# Patient Record
Sex: Male | Born: 1990 | Race: Black or African American | Hispanic: No | Marital: Single | Smoking: Never smoker
Health system: Southern US, Community
[De-identification: ages and names within clinical notes are randomized; demographics above are authoritative.]

## PROBLEM LIST (undated history)

## (undated) DIAGNOSIS — T7840XA Allergy, unspecified, initial encounter: Secondary | ICD-10-CM

## (undated) DIAGNOSIS — E119 Type 2 diabetes mellitus without complications: Secondary | ICD-10-CM

## (undated) HISTORY — PX: KNEE SURGERY: SHX244

## (undated) HISTORY — DX: Allergy, unspecified, initial encounter: T78.40XA

---
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
## 2008-05-03 ENCOUNTER — Emergency Department (HOSPITAL_COMMUNITY): Admission: EM | Admit: 2008-05-03 | Discharge: 2008-05-03 | Payer: Self-pay | Admitting: Emergency Medicine

## 2010-03-06 ENCOUNTER — Emergency Department (HOSPITAL_COMMUNITY): Admission: EM | Admit: 2010-03-06 | Discharge: 2010-03-06 | Payer: Self-pay | Admitting: Emergency Medicine

## 2011-05-10 NOTE — Op Note (Signed)
NAME:  Calvin Huber, REMER             ACCOUNT NO.:  [id number]   MEDICAL RECORD NO.:  [id number]          PATIENT TYPE:  AMB   LOCATION:  SDS                          FACILITY:  [facility]   PHYSICIAN:  Burnard Bunting, M.D.    DATE OF BIRTH:  August 25, 1991   DATE OF PROCEDURE:  08/06/2005  DATE OF DISCHARGE:                                 OPERATIVE REPORT   PREOPERATIVE DIAGNOSIS:  Right knee lateral meniscal tear.   POSTOPERATIVE DIAGNOSIS:  Right knee lateral meniscal tear.   OPERATION PERFORMED:  Right knee diagnostic and operative arthroscopy with  partial lateral meniscectomy.   SURGEON:  Burnard Bunting, M.D.   ASSISTANT:  Jerolyn Shin. Tresa Res, M.D.   ANESTHESIA:  General endotracheal.   ESTIMATED BLOOD LOSS:  Minimal.   TOURNIQUET TIME:  36 minutes at 350 mmHg.   OPERATIVE FINDINGS:  1.  Examination under anesthesia, range of motion 0 to 130 degrees,      stability to varus and valgus stress.  ACL and PCL intact.  2.  Diagnostic and operative arthroscopy.  (1) Intact patellofemoral      compartment.  (2)  Intact medial and lateral gutters with no loose      bodies.  (3) Intact medial compartment.  (4) Intact  anterior cruciate      ligament and posterior cruciate ligament.  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   DESCRIPTION OF PROCEDURE:  The patient was brought to the operating room  where general endotracheal anesthesia was introduced.  Preoperative  antibiotics were administered.  The right leg was prepped with DuraPrep  solution and draped in sterile manner.  The leg was elevated and  exsanguinated with Esmarch.  Tourniquet was inflated.  Anterior inferior and  lateral portal was created.  Anterior inferior medial portal was  then  created under direct visualization after topographical anatomy was defined.  Patellofemoral compartment was intact.  Medial compartment was intact.  ACL  and PCL was intact.  Patient did have a lateral meniscal tear which was a  bucket handle type in horizontal orientation.  There was radial tear through  the bucket handle portion making the tear unrepairable. The tear was  resected back to a stable rim leaving approximately 40% of the anterior  posterior width and volume of the meniscus intact.  At this time instruments  were removed from the portals.  Solution of Marcaine,  morphine and clonidine was injected into the knee.  The patient's portals  were closed using a 3-0 nylon suture.  Tourniquet was released.  Compressive  dressing was applied.  The patient tolerated the procedure well without  immediate complications.           ______________________________  G. Dorene Grebe, M.D.     GSD/MEDQ  D:  08/06/2005  T:  08/06/2005  Job:  [id number]

## 2014-06-18 ENCOUNTER — Ambulatory Visit (INDEPENDENT_AMBULATORY_CARE_PROVIDER_SITE_OTHER): Payer: 59 | Admitting: Physician Assistant

## 2014-06-18 VITALS — BP 120/82 | HR 71 | Temp 98.6°F | Resp 20 | Ht 74.0 in | Wt >= 6400 oz

## 2014-06-18 DIAGNOSIS — Z0000 Encounter for general adult medical examination without abnormal findings: Secondary | ICD-10-CM

## 2014-06-18 DIAGNOSIS — Z113 Encounter for screening for infections with a predominantly sexual mode of transmission: Secondary | ICD-10-CM

## 2014-06-18 LAB — POCT URINALYSIS DIPSTICK
Bilirubin, UA: NEGATIVE
Glucose, UA: NEGATIVE
KETONES UA: NEGATIVE
Leukocytes, UA: NEGATIVE
Nitrite, UA: NEGATIVE
PH UA: 5
PROTEIN UA: NEGATIVE
RBC UA: NEGATIVE
SPEC GRAV UA: 1.015
Urobilinogen, UA: 0.2

## 2014-06-18 LAB — TSH: TSH: 1.259 u[IU]/mL (ref 0.350–4.500)

## 2014-06-18 LAB — LIPID PANEL
Cholesterol: 176 mg/dL (ref 0–200)
HDL: 49 mg/dL (ref >39)
LDL Cholesterol: 114 mg/dL — ABNORMAL HIGH (ref 0–99)
TRIGLYCERIDES: 63 mg/dL (ref <150)
Total CHOL/HDL Ratio: 3.6 Ratio
VLDL: 13 mg/dL (ref 0–40)

## 2014-06-18 LAB — CBC
HCT: 42.3 % (ref 39.0–52.0)
Hemoglobin: 14.3 g/dL (ref 13.0–17.0)
MCH: 26.7 pg (ref 26.0–34.0)
MCHC: 33.8 g/dL (ref 30.0–36.0)
MCV: 79.1 fL (ref 78.0–100.0)
Platelets: 275 10*3/uL (ref 150–400)
RBC: 5.35 MIL/uL (ref 4.22–5.81)
RDW: 14.4 % (ref 11.5–15.5)
WBC: 7.7 10*3/uL (ref 4.0–10.5)

## 2014-06-18 LAB — COMPREHENSIVE METABOLIC PANEL
ALBUMIN: 4.5 g/dL (ref 3.5–5.2)
ALT: 29 U/L (ref 0–53)
AST: 21 U/L (ref 0–37)
Alkaline Phosphatase: 37 U/L — ABNORMAL LOW (ref 39–117)
BILIRUBIN TOTAL: 0.6 mg/dL (ref 0.2–1.2)
BUN: 12 mg/dL (ref 6–23)
CHLORIDE: 102 meq/L (ref 96–112)
CO2: 29 mEq/L (ref 19–32)
CREATININE: 1.22 mg/dL (ref 0.50–1.35)
Calcium: 9.6 mg/dL (ref 8.4–10.5)
Glucose, Bld: 97 mg/dL (ref 70–99)
POTASSIUM: 4.7 meq/L (ref 3.5–5.3)
SODIUM: 140 meq/L (ref 135–145)
TOTAL PROTEIN: 7.7 g/dL (ref 6.0–8.3)

## 2014-06-18 LAB — RPR

## 2014-06-18 LAB — HIV ANTIBODY (ROUTINE TESTING W REFLEX): HIV 1&2 Ab, 4th Generation: NONREACTIVE

## 2014-06-18 NOTE — Patient Instructions (Signed)
I will let you know when your lab results are back and if we need to do anything based on them  Everything looks good on exam, but it will be very beneficial for you to work on weight loss.  Your BMI is 51.77 which puts you in the "severe obesity" category (normal BMI is 20-24.)  You are already doing many good things for your health, but we just need to optimize these!  Diet:  Try to consume around 1900-2000 calories per day.  I recommend using an app like MyFitnessPal or LoseIt to keep track of your calories - my guess is that you are taking in more than you think you are!  Make sure to document everything you consume (including drinks!)  Plan to track your calories for a week or so before making any changes, so we know your baseline.    Foods to eat:  Anything fresh!  Vegetables, fruits, whole grains lean meats.  Plenty of water to drink  Foods to avoid:  Anything processed (i.e. Anything that comes in a package)  Limit fast food, take out, junk food.  Try to completely eliminate sugar sweetened beverages like soda, sweet tea, sports drinks and juices.  Occasional diet (i.e. Calorie free) drinks are ok  Exercise:  Aim for 150 minutes per week of aerobic exercise.  I want you working hard enough that you could carry on a conversation in short bursts but not be able to sing a song because you are working so hard.  My guess is that you are not exercising as much or as intensely as you think you are.  A mix of cardiovascular and strength training will be most beneficial.  Walking is great - just make sure you are walking briskly!  If you are having trouble losing weight with lifestyle modifications alone, we may want to consider discussing weight loss surgery.  That is a BIG decision, but I think you would see a lot of benefit from it and would be healthier over the long term if we could help you get some weight off  Keep up the good work!  And plan to follow up in about a year for another physical,  sooner if anything comes up or you want to discuss weight loss further  Dentists: [facility]  [phone] PaulsJerry Reeves DDS [phone]   Health Maintenance A healthy lifestyle and preventative care can promote health and wellness.  Maintain regular health, dental, and eye exams.  Eat a healthy diet. Foods like vegetables, fruits, whole grains, low-fat dairy products, and lean protein foods contain the nutrients you need and are low in calories. Decrease your intake of foods high in solid fats, added sugars, and salt. Get information about a proper diet from your health care provider, if necessary.  Regular physical exercise is one of the most important things you can do for your health. Most adults should get at least 150 minutes of moderate-intensity exercise (any activity that increases your heart rate and causes you to sweat) each week. In addition, most adults need muscle-strengthening exercises on 2 or more days a week.   Maintain a healthy weight. The body mass index (BMI) is a screening tool to identify possible weight problems. It provides an estimate of body fat based on height and weight. Your health care provider can find your BMI and can help you achieve or maintain a healthy weight. For males 20 years and older:  A BMI below 18.5 is considered underweight.  A BMI of 18.5 to 24.9 is normal.  A BMI of 25 to 29.9 is considered overweight.  A BMI of 30 and above is considered obese.  Maintain normal blood lipids and cholesterol by exercising and minimizing your intake of saturated fat. Eat a balanced diet with plenty of fruits and vegetables. Blood tests for lipids and cholesterol should begin at age 97 and be repeated every 5 years. If your lipid or cholesterol levels are high, you are over age 66, or you are at high risk for heart disease, you may need your cholesterol levels checked more frequently.Ongoing high lipid and cholesterol levels should be treated with  medicines if diet and exercise are not working.  If you smoke, find out from your health care provider how to quit. If you do not use tobacco, do not start.  Lung cancer screening is recommended for adults aged 55-80 years who are at high risk for developing lung cancer because of a history of smoking. A yearly low-dose CT scan of the lungs is recommended for people who have at least a 30-pack-year history of smoking and are current smokers or have quit within the past 15 years. A pack year of smoking is smoking an average of 1 pack of cigarettes a day for 1 year (for example, a 30-pack-year history of smoking could mean smoking 1 pack a day for 30 years or 2 packs a day for 15 years). Yearly screening should continue until the smoker has stopped smoking for at least 15 years. Yearly screening should be stopped for people who develop a health problem that would prevent them from having lung cancer treatment.  If you choose to drink alcohol, do not have more than 2 drinks per day. One drink is considered to be 12 oz (360 mL) of beer, 5 oz (150 mL) of wine, or 1.5 oz (45 mL) of liquor.  Avoid the use of street drugs. Do not share needles with anyone. Ask for help if you need support or instructions about stopping the use of drugs.  High blood pressure causes heart disease and increases the risk of stroke. Blood pressure should be checked at least every 1-2 years. Ongoing high blood pressure should be treated with medicines if weight loss and exercise are not effective.  If you are 10-36 years old, ask your health care provider if you should take aspirin to prevent heart disease.  Diabetes screening involves taking a blood sample to check your fasting blood sugar level. This should be done once every 3 years after age 57 if you are at a normal weight and without risk factors for diabetes. Testing should be considered at a younger age or be carried out more frequently if you are overweight and have at  least 1 risk factor for diabetes.  Colorectal cancer can be detected and often prevented. Most routine colorectal cancer screening begins at the age of 80 and continues through age 66. However, your health care provider may recommend screening at an earlier age if you have risk factors for colon cancer. On a yearly basis, your health care provider may provide home test kits to check for hidden blood in the stool. A small camera at the end of a tube may be used to directly examine the colon (sigmoidoscopy or colonoscopy) to detect the earliest forms of colorectal cancer. Talk to your health care provider about this at age 43 when routine screening begins. A direct exam of the colon should be repeated every 5-10 years  through age 25, unless early forms of precancerous polyps or small growths are found.  People who are at an increased risk for hepatitis B should be screened for this virus. You are considered at high risk for hepatitis B if:  You were born in a country where hepatitis B occurs often. Talk with your health care provider about which countries are considered high risk.  Your parents were born in a high-risk country and you have not received a shot to protect against hepatitis B (hepatitis B vaccine).  You have HIV or AIDS.  You use needles to inject street drugs.  You live with, or have sex with, someone who has hepatitis B.  You are a man who has sex with other men (MSM).  You get hemodialysis treatment.  You take certain medicines for conditions like cancer, organ transplantation, and autoimmune conditions.  Hepatitis C blood testing is recommended for all people born from [birth year] through 1965 and any individual with known risk factors for hepatitis C.  Healthy men should no longer receive prostate-specific antigen (PSA) blood tests as part of routine cancer screening. Talk to your health care provider about prostate cancer screening.  Testicular cancer screening is not recommended  for adolescents or adult males who have no symptoms. Screening includes self-exam, a health care provider exam, and other screening tests. Consult with your health care provider about any symptoms you have or any concerns you have about testicular cancer.  Practice safe sex. Use condoms and avoid high-risk sexual practices to reduce the spread of sexually transmitted infections (STIs).  You should be screened for STIs, including gonorrhea and chlamydia if:  You are sexually active and are younger than 24 years.  You are older than 24 years, and your health care provider tells you that you are at risk for this type of infection.  Your sexual activity has changed since you were last screened, and you are at an increased risk for chlamydia or gonorrhea. Ask your health care provider if you are at risk.  If you are at risk of being infected with HIV, it is recommended that you take a prescription medicine daily to prevent HIV infection. This is called pre-exposure prophylaxis (PrEP). You are considered at risk if:  You are a man who has sex with other men (MSM).  You are a heterosexual man who is sexually active with multiple partners.  You take drugs by injection.  You are sexually active with a partner who has HIV.  Talk with your health care provider about whether you are at high risk of being infected with HIV. If you choose to begin PrEP, you should first be tested for HIV. You should then be tested every 3 months for as long as you are taking PrEP.  Use sunscreen. Apply sunscreen liberally and repeatedly throughout the day. You should seek shade when your shadow is shorter than you. Protect yourself by wearing long sleeves, pants, a wide-brimmed hat, and sunglasses year round whenever you are outdoors.  Tell your health care provider of new moles or changes in moles, especially if there is a change in shape or color. Also, tell your health care provider if a mole is larger than the size  of a pencil eraser.  A one-time screening for abdominal aortic aneurysm (AAA) and surgical repair of large AAAs by ultrasound is recommended for men aged 65-75 years who are current or former smokers.  Stay current with your vaccines (immunizations). Document Released: 06/06/2008 Document Revised: 12/14/2013  Document Reviewed: 05/06/2011 [facility] Patient Information 2015 [place]. This information is not intended to replace advice given to you by your health care provider. Make sure you discuss any questions you have with your health care provider.

## 2014-06-18 NOTE — Progress Notes (Signed)
   Subjective:    Patient ID: Calvin Huber, male    DOB: May 05, 1991, 23 y.o.   MRN: [id number]  HPI  Calvin Huber is a very pleasant 23 yr old male here for CPE.  Last CPE 2013.  No PCP  Complaints:  none Imm:  Thinks utd - thinks tetanus 2012 Dentist:  Last 2010; [place] in [place] Eye doctor:  No corrective lenses Diet:  Varied diet - more vegetables than fruits; lots of eating out due to travel for work; plenty of water; no soda but does drink sweet tea, sports, drinks, juice Exercise:  Basketball, walking, running - once per week Meds: none PMH:  None Sexually active 1 male partner; condoms 50% of the time; no specific concern for STI, would like testing, last tested 2011  Work:  Emergency planning/management officerroject manager, travels frequently   Review of Systems  Constitutional: Negative.   HENT: Negative.   Eyes: Negative.   Respiratory: Negative.   Cardiovascular: Negative.   Gastrointestinal: Negative.   Genitourinary: Negative.   Musculoskeletal: Negative.   Skin: Negative.   Neurological: Negative.        Objective:   Physical Exam  Vitals reviewed. Constitutional: He is oriented to person, place, and time. He appears well-developed and well-nourished. No distress.  HENT:  Head: Normocephalic and atraumatic.  Right Ear: Tympanic membrane and ear canal normal.  Left Ear: Tympanic membrane and ear canal normal.  Mouth/Throat: Uvula is midline, oropharynx is clear and moist and mucous membranes are normal.  Eyes: Conjunctivae and EOM are normal. Pupils are equal, round, and reactive to light. No scleral icterus.  Neck: Normal range of motion. Neck supple.  Cardiovascular: Normal rate, regular rhythm, normal heart sounds and intact distal pulses.   No murmur heard. Pulmonary/Chest: Effort normal and breath sounds normal. He has no wheezes. He has no rales.  Abdominal: Soft. Bowel sounds are normal. There is no tenderness.  Lymphadenopathy:    He has no cervical adenopathy.    Neurological: He is alert and oriented to person, place, and time. He has normal reflexes.  Skin: Skin is warm and dry.  Psychiatric: He has a normal mood and affect. His behavior is normal.        Assessment & Plan:  1. Routine general medical examination at a health care facility Calvin Huber is a very pleasant 23 yr old male here for CPE.  He appears to be in good health and exam is normal.  Labs drawn.  Discussed lifestyle modification (see below)  - CBC - POCT urinalysis dipstick - Comprehensive metabolic panel - GC/Chlamydia Probe Amp - HIV antibody - Lipid panel - RPR - TSH  2. Screen for STD (sexually transmitted disease)  - GC/Chlamydia Probe Amp - HIV antibody - RPR  3. Severe obesity (BMI >= 40) Pt with BMI 51.77.  Discussed with pt that this is very high and that weight loss will be very beneficial for him.  We discussed weight loss strategies.  See patient instructions for more details.  We very briefly discussed the possibility of surgical intervention, but pt is confident that he can bring his weight down with lifestyle mods alone  - Comprehensive metabolic panel - Lipid panel    E. Frances FurbishElizabeth Egan MHS, PA-C [facility] 6/27/20156:43 PM

## 2014-06-21 ENCOUNTER — Telehealth: Payer: Self-pay

## 2014-06-21 LAB — GC/CHLAMYDIA PROBE AMP
CT Probe RNA: NEGATIVE
GC PROBE AMP APTIMA: NEGATIVE

## 2014-06-21 NOTE — Telephone Encounter (Signed)
Spoke with pt and informed him his labs were normal and all STD/STI testing was negative.

## 2014-12-04 ENCOUNTER — Encounter (HOSPITAL_COMMUNITY): Payer: Self-pay | Admitting: Emergency Medicine

## 2014-12-04 ENCOUNTER — Emergency Department (INDEPENDENT_AMBULATORY_CARE_PROVIDER_SITE_OTHER)
Admission: EM | Admit: 2014-12-04 | Discharge: 2014-12-04 | Disposition: A | Discharge status: Home / Self Care | Payer: 59 | Source: Home / Self Care

## 2014-12-04 DIAGNOSIS — J020 Streptococcal pharyngitis: Secondary | ICD-10-CM

## 2014-12-04 LAB — POCT RAPID STREP A: STREPTOCOCCUS, GROUP A SCREEN (DIRECT): POSITIVE — AB

## 2014-12-04 MED ORDER — CEFDINIR 300 MG PO CAPS: 300.0000 mg | ORAL_CAPSULE | Freq: Two times a day (BID) | ORAL | Status: DC

## 2014-12-04 NOTE — Discharge Instructions (Signed)
Drink lots of fluids, take all of medicine, use lozenges as needed.return if needed °

## 2014-12-04 NOTE — ED Provider Notes (Signed)
CSN: [id number]     Arrival date & time 12/04/14  1125 History   None    Chief Complaint  Patient presents with  . Sore Throat   (Consider location/radiation/quality/duration/timing/severity/associated sxs/prior Treatment) Patient is a 23 y.o. male presenting with pharyngitis. The history is provided by the patient and the spouse.  Sore Throat This is a new problem. The current episode started 2 days ago. The problem has been gradually worsening. Pertinent negatives include no chest pain and no abdominal pain. The symptoms are aggravated by swallowing.    History reviewed. No pertinent past medical history. Past Surgical History  Procedure Laterality Date  . Knee surgery     Family History  Problem Relation Age of Onset  . Diabetes Mother   . Diabetes Sister    History  Substance Use Topics  . Smoking status: Never Smoker   . Smokeless tobacco: Never Used  . Alcohol Use: No    Review of Systems  Constitutional: Positive for fever, chills and appetite change.  HENT: Negative for congestion and postnasal drip.   Respiratory: Negative.   Cardiovascular: Negative.  Negative for chest pain.  Gastrointestinal: Negative.  Negative for abdominal pain.  Genitourinary: Negative.     Allergies  Review of patient's allergies indicates no known allergies.  Home Medications   Prior to Admission medications   Medication Sig Start Date End Date Taking? Authorizing Provider  cefdinir (OMNICEF) 300 MG capsule Take 1 capsule (300 mg total) by mouth 2 (two) times daily. 12/04/14   Linna HoffJames D Shereece Wellborn, MD   BP 148/101 mmHg  Pulse 85  Temp(Src) 98.9 F (37.2 C) (Oral)  Resp 20  SpO2 98% Physical Exam  Constitutional: He is oriented to person, place, and time. He appears well-developed and well-nourished. No distress.  HENT:  Head: Normocephalic.  Right Ear: External ear normal.  Left Ear: External ear normal.  Mouth/Throat: Uvula is midline and mucous membranes are normal.  Oropharyngeal exudate and posterior oropharyngeal erythema present.  Eyes: Conjunctivae are normal. Pupils are equal, round, and reactive to light.  Neck: Normal range of motion. Neck supple.  Cardiovascular: Regular rhythm, normal heart sounds and intact distal pulses.   Pulmonary/Chest: Effort normal and breath sounds normal.  Lymphadenopathy:    He has cervical adenopathy.  Neurological: He is alert and oriented to person, place, and time.  Skin: Skin is warm and dry.  Nursing note and vitals reviewed.   ED Course  Procedures (including critical care time) Labs Review Labs Reviewed  POCT RAPID STREP A ([facility] ONLY) - Abnormal; Notable for the following:    Streptococcus, Group A Screen (Direct) POSITIVE (*)    All other components within normal limits    Imaging Review No results found.   MDM   1. Streptococcal sore throat        Linna HoffJames D Camyra Vaeth, MD 12/05/14 1540

## 2014-12-04 NOTE — ED Notes (Signed)
Pt states that he has had a hard time swallowing and that it is very painful as well to swallow.

## 2015-08-25 ENCOUNTER — Emergency Department (HOSPITAL_COMMUNITY)
Admission: EM | Admit: 2015-08-25 | Discharge: 2015-08-25 | Disposition: A | Discharge status: Home / Self Care | Payer: Commercial Managed Care - PPO | Source: Home / Self Care | Attending: Emergency Medicine | Admitting: Emergency Medicine

## 2015-08-25 ENCOUNTER — Encounter (HOSPITAL_COMMUNITY): Payer: Self-pay | Admitting: Emergency Medicine

## 2015-08-25 DIAGNOSIS — K219 Gastro-esophageal reflux disease without esophagitis: Secondary | ICD-10-CM | POA: Diagnosis not present

## 2015-08-25 MED ORDER — OMEPRAZOLE 40 MG PO CPDR: 40.0000 mg | DELAYED_RELEASE_CAPSULE | Freq: Every day | ORAL | Status: DC

## 2015-08-25 NOTE — ED Notes (Signed)
C/o acid reflux onset 2 weeks Sx include fatigue and dry mouth; sx increase w/food Has been taking OTC rolaids w/temp relief Alert... No acute distress.

## 2015-08-25 NOTE — ED Provider Notes (Signed)
CSN: [id number]     Arrival date & time 08/25/15  1739 History   First MD Initiated Contact with Patient 08/25/15 1911     Chief Complaint  Patient presents with  . Gastrophageal Reflux   (Consider location/radiation/quality/duration/timing/severity/associated sxs/prior Treatment) HPI He is a 24 year old man here for evaluation of heartburn. He states for the last 2 weeks he has had episodes of burning epigastric pain that radiates to his chest. They typically occur after eating spicy food. He has stopped eating spicy food at this time. The pain will last for about 5 minutes. He has taken over-the-counter Rolaids which do help some. He denies any shortness of breath, diaphoresis, dizziness, chest pain associated with these episodes. He does not drink coffee or alcohol. He does drink a lot of carbonated water as well as tomatoes and citrus.  He denies any current pain.  History reviewed. No pertinent past medical history. Past Surgical History  Procedure Laterality Date  . Knee surgery     Family History  Problem Relation Age of Onset  . Diabetes Mother   . Diabetes Sister    Social History  Substance Use Topics  . Smoking status: Never Smoker   . Smokeless tobacco: Never Used  . Alcohol Use: No    Review of Systems As in history of present illness Allergies  Review of patient's allergies indicates no known allergies.  Home Medications   Prior to Admission medications   Medication Sig Start Date End Date Taking? Authorizing Provider  omeprazole (PRILOSEC) 40 MG capsule Take 1 capsule (40 mg total) by mouth daily. 08/25/15   Charm Rings, MD   Meds Ordered and Administered this Visit  Medications - No data to display  BP 106/83 mmHg  Pulse 99  Temp(Src) 97.8 F (36.6 C) (Other (Comment))  Resp 16  SpO2 100% No data found.   Physical Exam  Constitutional: He is oriented to person, place, and time. He appears well-developed and well-nourished. No distress.  Morbidly  obese  Cardiovascular: Normal rate, regular rhythm and normal heart sounds.   No murmur heard. Pulmonary/Chest: Effort normal and breath sounds normal. No respiratory distress. He has no wheezes. He has no rales.  Abdominal: Soft. Bowel sounds are normal. He exhibits no distension. There is no tenderness. There is no rebound and no guarding.  Neurological: He is alert and oriented to person, place, and time.    ED Course  Procedures (including critical care time)  Labs Review Labs Reviewed - No data to display  Imaging Review No results found.   MDM   1. Gastroesophageal reflux disease, esophagitis presence not specified    We'll treat with 2 weeks of omeprazole. Discussed gradual return to acidic foods. Follow-up as needed.    Charm Rings, MD 08/25/15 [phone]

## 2015-08-25 NOTE — Discharge Instructions (Signed)
You are having heartburn or reflux. Take omeprazole daily for the next 2 weeks.  After that, you can use it as needed. Once you are off omeprazole, you can gradually add back citrus, tomatoes, carbonated drinks, and spicy food. Follow-up as needed.

## 2015-09-14 ENCOUNTER — Encounter (HOSPITAL_COMMUNITY): Payer: Self-pay | Admitting: Emergency Medicine

## 2015-09-14 ENCOUNTER — Encounter (HOSPITAL_COMMUNITY): Payer: Self-pay | Admitting: *Deleted

## 2015-09-14 ENCOUNTER — Observation Stay (HOSPITAL_COMMUNITY)
Admission: EM | Admit: 2015-09-14 | Discharge: 2015-09-16 | Disposition: A | Discharge status: Home / Self Care | Payer: Commercial Managed Care - PPO | Attending: Internal Medicine | Admitting: Internal Medicine

## 2015-09-14 ENCOUNTER — Emergency Department (HOSPITAL_COMMUNITY)
Admission: EM | Admit: 2015-09-14 | Discharge: 2015-09-14 | Disposition: A | Discharge status: Home / Self Care | Payer: Commercial Managed Care - PPO | Source: Home / Self Care | Attending: Family Medicine | Admitting: Family Medicine

## 2015-09-14 DIAGNOSIS — E119 Type 2 diabetes mellitus without complications: Secondary | ICD-10-CM

## 2015-09-14 DIAGNOSIS — E1065 Type 1 diabetes mellitus with hyperglycemia: Secondary | ICD-10-CM | POA: Insufficient documentation

## 2015-09-14 DIAGNOSIS — R809 Proteinuria, unspecified: Secondary | ICD-10-CM | POA: Diagnosis not present

## 2015-09-14 DIAGNOSIS — E860 Dehydration: Secondary | ICD-10-CM | POA: Diagnosis not present

## 2015-09-14 DIAGNOSIS — N179 Acute kidney failure, unspecified: Secondary | ICD-10-CM | POA: Diagnosis not present

## 2015-09-14 DIAGNOSIS — E669 Obesity, unspecified: Secondary | ICD-10-CM | POA: Diagnosis not present

## 2015-09-14 DIAGNOSIS — R824 Acetonuria: Secondary | ICD-10-CM

## 2015-09-14 DIAGNOSIS — Z6841 Body Mass Index (BMI) 40.0 and over, adult: Secondary | ICD-10-CM | POA: Diagnosis not present

## 2015-09-14 DIAGNOSIS — Z23 Encounter for immunization: Secondary | ICD-10-CM | POA: Diagnosis not present

## 2015-09-14 DIAGNOSIS — E871 Hypo-osmolality and hyponatremia: Secondary | ICD-10-CM | POA: Diagnosis not present

## 2015-09-14 DIAGNOSIS — Z833 Family history of diabetes mellitus: Secondary | ICD-10-CM | POA: Diagnosis not present

## 2015-09-14 DIAGNOSIS — R739 Hyperglycemia, unspecified: Secondary | ICD-10-CM | POA: Diagnosis present

## 2015-09-14 DIAGNOSIS — R5383 Other fatigue: Secondary | ICD-10-CM | POA: Diagnosis present

## 2015-09-14 DIAGNOSIS — IMO0002 Reserved for concepts with insufficient information to code with codable children: Secondary | ICD-10-CM | POA: Insufficient documentation

## 2015-09-14 DIAGNOSIS — E1165 Type 2 diabetes mellitus with hyperglycemia: Secondary | ICD-10-CM

## 2015-09-14 LAB — POCT URINALYSIS DIP (DEVICE)
GLUCOSE, UA: 500 mg/dL — AB
KETONES UR: 80 mg/dL — AB
Leukocytes, UA: NEGATIVE
NITRITE: NEGATIVE
PH: 5.5 (ref 5.0–8.0)
Protein, ur: 30 mg/dL — AB
Specific Gravity, Urine: 1.015 (ref 1.005–1.030)
Urobilinogen, UA: 0.2 mg/dL (ref 0.0–1.0)

## 2015-09-14 LAB — COMPREHENSIVE METABOLIC PANEL
ALBUMIN: 3.4 g/dL — AB (ref 3.5–5.0)
ALT: 51 U/L (ref 17–63)
AST: 32 U/L (ref 15–41)
Alkaline Phosphatase: 58 U/L (ref 38–126)
Anion gap: 15 (ref 5–15)
BUN: 12 mg/dL (ref 6–20)
CHLORIDE: 99 mmol/L — AB (ref 101–111)
CO2: 15 mmol/L — ABNORMAL LOW (ref 22–32)
CREATININE: 1.47 mg/dL — AB (ref 0.61–1.24)
Calcium: 8.7 mg/dL — ABNORMAL LOW (ref 8.9–10.3)
GFR calc Af Amer: >60 mL/min (ref >60)
GLUCOSE: 464 mg/dL — AB (ref 65–99)
POTASSIUM: 4.4 mmol/L (ref 3.5–5.1)
Sodium: 129 mmol/L — ABNORMAL LOW (ref 135–145)
Total Bilirubin: 1.2 mg/dL (ref 0.3–1.2)
Total Protein: 7.2 g/dL (ref 6.5–8.1)

## 2015-09-14 LAB — CBC WITH DIFFERENTIAL/PLATELET
BASOS ABS: 0 10*3/uL (ref 0.0–0.1)
BASOS PCT: 1 %
EOS PCT: 1 %
Eosinophils Absolute: 0.1 10*3/uL (ref 0.0–0.7)
HEMATOCRIT: 45.6 % (ref 39.0–52.0)
Hemoglobin: 15.6 g/dL (ref 13.0–17.0)
LYMPHS PCT: 33 %
Lymphs Abs: 2 10*3/uL (ref 0.7–4.0)
MCH: 27.6 pg (ref 26.0–34.0)
MCHC: 34.2 g/dL (ref 30.0–36.0)
MCV: 80.6 fL (ref 78.0–100.0)
MONO ABS: 0.5 10*3/uL (ref 0.1–1.0)
Monocytes Relative: 9 %
NEUTROS ABS: 3.5 10*3/uL (ref 1.7–7.7)
Neutrophils Relative %: 56 %
PLATELETS: 185 10*3/uL (ref 150–400)
RBC: 5.66 MIL/uL (ref 4.22–5.81)
RDW: 14.4 % (ref 11.5–15.5)
WBC: 6.1 10*3/uL (ref 4.0–10.5)

## 2015-09-14 LAB — POCT I-STAT, CHEM 8
BUN: 16 mg/dL (ref 6–20)
Calcium, Ion: 1.22 mmol/L (ref 1.12–1.23)
Chloride: 99 mmol/L — ABNORMAL LOW (ref 101–111)
Creatinine, Ser: 1.1 mg/dL (ref 0.61–1.24)
Glucose, Bld: 485 mg/dL — ABNORMAL HIGH (ref 65–99)
HEMATOCRIT: 56 % — AB (ref 39.0–52.0)
HEMOGLOBIN: 19 g/dL — AB (ref 13.0–17.0)
POTASSIUM: 4.9 mmol/L (ref 3.5–5.1)
SODIUM: 131 mmol/L — AB (ref 135–145)
TCO2: 15 mmol/L (ref 0–100)

## 2015-09-14 LAB — CBG MONITORING, ED
GLUCOSE-CAPILLARY: 370 mg/dL — AB (ref 65–99)
Glucose-Capillary: 432 mg/dL — ABNORMAL HIGH (ref 65–99)
Glucose-Capillary: 446 mg/dL — ABNORMAL HIGH (ref 65–99)

## 2015-09-14 MED ORDER — SODIUM CHLORIDE 0.45 % IV BOLUS
1000.0000 mL | Freq: Once | INTRAVENOUS | Status: DC
Start: 2015-09-14 — End: 2015-09-14

## 2015-09-14 MED ORDER — SODIUM CHLORIDE 0.9 % IV BOLUS (SEPSIS)
1000.0000 mL | Freq: Once | INTRAVENOUS | Status: AC
  Administered 2015-09-14: 1000 mL via INTRAVENOUS

## 2015-09-14 MED ORDER — SODIUM CHLORIDE 0.9 % IV SOLN
INTRAVENOUS | Status: DC
  Administered 2015-09-14: 3.9 [IU]/h via INTRAVENOUS
  Administered 2015-09-15: 2 [IU]/h via INTRAVENOUS
  Filled 2015-09-14: qty 2.5

## 2015-09-14 NOTE — H&P (Signed)
[facility]                                                                                    Patient Demographics  Calvin Huber, is a 24 y.o. male  CSN: [id number]  MRN: [id number]  DOB - [birth year]  Admit Date - 09/14/2015  Outpatient Primary MD for the patient is No primary care Ranika Mcniel on file.   With History of -  History reviewed. No pertinent past medical history.    Past Surgical History  Procedure Laterality Date  . Knee surgery      in for   Chief Complaint  Patient presents with  . Fatigue     HPI  Calvin Huber  is a 24 y.o. male, presented to our emergency room with worsening fatigue in the last few days. The patient was found to be hyperglycemic. Patient has no history of diabetes hypertension or any other medical illness. Patient does not have a PMD.    Review of Systems    In addition to the HPI above,  No Fever-chills, No Headache, No changes with Vision or hearing, No problems swallowing food or Liquids, No Chest pain, Cough or Shortness of Breath, No Abdominal pain, No Nausea or Vommitting, Bowel movements are regular, No Blood in stool or Urine, No dysuria, No new skin rashes or bruises, No new joints pains-aches,  No new weakness, tingling, numbness in any extremity, No recent weight gain or loss, No significant Mental Stressors.  A full 10 point Review of Systems was done, except as stated above, all other Review of Systems were negative.   Social History Social History  Substance Use Topics  . Smoking status: Never Smoker   . Smokeless tobacco: Never Used  . Alcohol Use: No     Family History Family History  Problem Relation Age of Onset  . Diabetes Mother   . Diabetes Sister      Prior to Admission medications   Medication Sig Start Date End Date Taking? Authorizing Nazaret Chea  omeprazole (PRILOSEC) 40 MG capsule Take 1 capsule (40 mg total) by mouth daily. 08/25/15  Yes Charm Rings, MD    No  Known Allergies  Physical Exam  Vitals  Blood pressure 147/82, pulse 104, temperature 98.1 F (36.7 C), temperature source Oral, resp. rate 16, SpO2 100 %.   1. General Young gentleman well-developed,  obese  2. Normal affect and insight, Not Suicidal or Homicidal, Awake Alert, Oriented X 3.  3. No F.N deficits, grossly ALL C.Nerves Intact,   4. Ears and Eyes appear Normal, Conjunctivae clear, PERRLA. Moist Oral Mucosa.  5. Supple Neck, No JVD, No cervical lymphadenopathy appriciated, No Carotid Bruits.  6. Symmetrical Chest wall movement, Good air movement bilaterally, CTAB.  7. RRR, No Gallops, Rubs or Murmurs, No Parasternal Heave.  8. Positive Bowel Sounds, Abdomen Soft, Non tender, No organomegaly appriciated,No rebound -guarding or rigidity.  9.  No Cyanosis, Normal Skin Turgor, No Skin Rash or Bruise.  10. Good muscle tone,  joints appear normal , no effusions, Normal ROM.  11. No Palpable Lymph Nodes in Neck or Axillae    Data Review  CBC  Recent Labs Lab 09/14/15 1514 09/14/15 2131  WBC  --  6.1  HGB 19.0* 15.6  HCT 56.0* 45.6  PLT  --  185  MCV  --  80.6  MCH  --  27.6  MCHC  --  34.2  RDW  --  14.4  LYMPHSABS  --  2.0  MONOABS  --  0.5  EOSABS  --  0.1  BASOSABS  --  0.0   ------------------------------------------------------------------------------------------------------------------  Chemistries   Recent Labs Lab 09/14/15 1514 09/14/15 2131  NA 131* 129*  K 4.9 4.4  CL 99* 99*  CO2  --  15*  GLUCOSE 485* 464*  BUN 16 12  CREATININE 1.10 1.47*  CALCIUM  --  8.7*  AST  --  32  ALT  --  51  ALKPHOS  --  58  BILITOT  --  1.2   ------------------------------------------------------------------------------------------------------------------ CrCl cannot be calculated (Unknown ideal weight.). ------------------------------------------------------------------------------------------------------------------ No results for input(s):  TSH, T4TOTAL, T3FREE, THYROIDAB in the last 72 hours.  Invalid input(s): FREET3   Coagulation profile No results for input(s): INR, PROTIME in the last 168 hours. ------------------------------------------------------------------------------------------------------------------- No results for input(s): DDIMER in the last 72 hours. -------------------------------------------------------------------------------------------------------------------  Cardiac Enzymes No results for input(s): CKMB, TROPONINI, MYOGLOBIN in the last 168 hours.  Invalid input(s): CK ------------------------------------------------------------------------------------------------------------------ Invalid input(s): POCBNP   ---------------------------------------------------------------------------------------------------------------  Urinalysis    Component Value Date/Time   LABSPEC 1.015 09/14/2015 1534   PHURINE 5.5 09/14/2015 1534   GLUCOSEU 500* 09/14/2015 1534   HGBUR SMALL* 09/14/2015 1534   BILIRUBINUR SMALL* 09/14/2015 1534   BILIRUBINUR neg 06/18/2014 1234   KETONESUR 80* 09/14/2015 1534   PROTEINUR 30* 09/14/2015 1534   PROTEINUR neg 06/18/2014 1234   UROBILINOGEN 0.2 09/14/2015 1534   UROBILINOGEN 0.2 06/18/2014 1234   NITRITE NEGATIVE 09/14/2015 1534   NITRITE neg 06/18/2014 1234   LEUKOCYTESUR NEGATIVE 09/14/2015 1534    ----------------------------------------------------------------------------------------------------------------     Imaging results:   No results found.    Assessment & Plan  1. New onset diabetes mellitus Plan Continue glucose stabilizer until blood sugar drops below 300 Start insulin sliding scale Consultant diabetes coordinator May start Lantus in a.m.   DVT Prophylaxis Lovenox  AM Labs Ordered, also please review Full Orders  Family Communication: Admission, patients condition and plan of care including tests being ordered have been discussed  with the patient and her friend who indicate understanding and agree with the plan and Code Status.  Code Status full  Disposition Plan: Home  Time spent in minutes : 33 minutes  Condition GUARDED   @SIGNATURE @

## 2015-09-14 NOTE — ED Provider Notes (Signed)
CSN: [id number]     Arrival date & time 09/14/15  1351 History   First MD Initiated Contact with Patient 09/14/15 1426     Chief Complaint  Patient presents with  . Fatigue  . Urinary Frequency   (Consider location/radiation/quality/duration/timing/severity/associated sxs/prior Treatment) HPI Comments: 24 year old severely obese male complaining of a lack of energy, polyuria, polydipsia and polyphagia for approximate 2 months. Symptoms have been progressing over this time. He is feeling weak and tired. Denies fever, chills. Denies chest pain, shortness of breath, abdominal pain. Yesterday he did have an episode of vomiting. He also has occasional cramping in his calves. Patient is a 24 y.o. male presenting with weakness. The history is provided by the patient.  Weakness This is a new problem. The current episode started more than 1 week ago. The problem occurs hourly. The problem has been rapidly worsening. Pertinent negatives include no chest pain, no abdominal pain, no headaches and no shortness of breath. Nothing aggravates the symptoms. Nothing relieves the symptoms.    History reviewed. No pertinent past medical history. Past Surgical History  Procedure Laterality Date  . Knee surgery     Family History  Problem Relation Age of Onset  . Diabetes Mother   . Diabetes Sister    Social History  Substance Use Topics  . Smoking status: Never Smoker   . Smokeless tobacco: Never Used  . Alcohol Use: No    Review of Systems  Constitutional: Positive for activity change and fatigue. Negative for fever.  HENT: Negative.   Respiratory: Negative for cough, chest tightness and shortness of breath.   Cardiovascular: Negative for chest pain, palpitations and leg swelling.  Gastrointestinal: Negative for abdominal pain.  Endocrine: Positive for polydipsia, polyphagia and polyuria.  Genitourinary: Negative for dysuria and hematuria.  Musculoskeletal: Negative for back pain, joint swelling  and neck pain.  Skin: Negative.   Neurological: Positive for weakness. Negative for headaches.  All other systems reviewed and are negative.   Allergies  Review of patient's allergies indicates no known allergies.  Home Medications   Prior to Admission medications   Medication Sig Start Date End Date Taking? Authorizing Provider  Multiple Vitamin (MULTIVITAMINS PO) Take by mouth.   Yes Historical Provider, MD  omeprazole (PRILOSEC) 40 MG capsule Take 1 capsule (40 mg total) by mouth daily. 08/25/15   Charm Rings, MD   Meds Ordered and Administered this Visit  Medications - No data to display  BP 120/80 mmHg  Pulse 118  Temp(Src) 98.3 F (36.8 C) (Oral)  Resp 16  SpO2 98% No data found.   Physical Exam  Constitutional: He is oriented to person, place, and time. He appears well-developed and well-nourished. No distress.  HENT:  Head: Normocephalic and atraumatic.  Eyes: Conjunctivae and EOM are normal.  Neck: Normal range of motion. Neck supple.  Cardiovascular: Regular rhythm and normal heart sounds.   No murmur heard. Apical tachycardia at 122  Pulmonary/Chest: Effort normal and breath sounds normal. No respiratory distress. He has no wheezes. He has no rales.  Musculoskeletal: Normal range of motion. He exhibits no edema or tenderness.  Neurological: He is alert and oriented to person, place, and time. No cranial nerve deficit. He exhibits normal muscle tone.  Skin: Skin is warm and dry. No rash noted.  Psychiatric: He has a normal mood and affect.  Nursing note and vitals reviewed.   ED Course  Procedures (including critical care time)  Labs Review Labs Reviewed  POCT I-STAT, CHEM  8 - Abnormal; Notable for the following:    Sodium 131 (*)    Chloride 99 (*)    Glucose, Bld 485 (*)    Hemoglobin 19.0 (*)    HCT 56.0 (*)    All other components within normal limits  POCT URINALYSIS DIP (DEVICE) - Abnormal; Notable for the following:    Glucose, UA 500 (*)     Bilirubin Urine SMALL (*)    Ketones, ur 80 (*)    Hgb urine dipstick SMALL (*)    Protein, ur 30 (*)    All other components within normal limits    Imaging Review No results found.   Visual Acuity Review  Right Eye Distance:   Left Eye Distance:   Bilateral Distance:    Right Eye Near:   Left Eye Near:    Bilateral Near:         MDM   1. Ketonuria   2. Type 2 diabetes mellitus with hyperglycemia   3. Proteinuria   4. Dehydration   5. Morbid exogenous obesity    Transfer this morbidly obese 24 year old male with newly diagnosed type 2 diabetes mellitus with hyperglycemia, dehydration and ketonuria to the emergency department for evaluation and management.    Hayden Rasmussen, NP 09/14/15 [phone]

## 2015-09-14 NOTE — ED Notes (Signed)
Pt's CBG is 432. Pt states that he has eaten since his CBG was last checked.

## 2015-09-14 NOTE — ED Provider Notes (Signed)
CSN: [id number]     Arrival date & time 09/14/15  1558 History  This chart was scribed for non-physician practitioner Catha Gosselin, PA-C working with Richardean Canal, MD by Lyndel Safe, ED Scribe. This patient was seen in room TR11C/TR11C and the patient's care was started at 8:29 PM.    Chief Complaint  Patient presents with  . Fatigue   No language interpreter was used.   HPI Comments: Calvin Huber is a 24 y.o. male who presents to the Emergency Department complaining of fatigue that has been progressing for 2 months with associated polyphagia, polydipsia and polyuria. The pt was evaluated for the same compliant at [facility] and was advised to come to [facility] ED for further evaluation after new onset type II diabetes diagnosis. PFhx of DM. He denies blurry vision, abdominal pain, CP or SOB, nausea, vomiting, or diarrhea.  Pt not followed by a PCP.   History reviewed. No pertinent past medical history. Past Surgical History  Procedure Laterality Date  . Knee surgery     Family History  Problem Relation Age of Onset  . Diabetes Mother   . Diabetes Sister    Social History  Substance Use Topics  . Smoking status: Never Smoker   . Smokeless tobacco: Never Used  . Alcohol Use: No    Review of Systems  Constitutional: Positive for fatigue.  Eyes: Negative for visual disturbance.  Respiratory: Negative for shortness of breath.   Cardiovascular: Negative for chest pain.  Gastrointestinal: Negative for nausea, vomiting, abdominal pain and diarrhea.  Endocrine: Positive for polydipsia, polyphagia and polyuria.   Allergies  Review of patient's allergies indicates no known allergies.  Home Medications   Prior to Admission medications   Medication Sig Start Date End Date Taking? Authorizing Provider  Multiple Vitamin (MULTIVITAMINS PO) Take by mouth.    Historical Provider, MD  omeprazole (PRILOSEC) 40 MG capsule Take 1 capsule (40 mg total) by mouth daily. 08/25/15   Charm Rings,  MD   BP 129/80 mmHg  Pulse 108  Temp(Src) 98.5 F (36.9 C) (Oral)  Resp 20  SpO2 100% Physical Exam  Constitutional: He is oriented to person, place, and time. He appears well-developed and well-nourished. No distress.  HENT:  Head: Normocephalic and atraumatic.  Dry mucous membranes.  No skin tenting.  Eyes: Conjunctivae are normal.  Neck: Normal range of motion. Neck supple.  Cardiovascular: Normal rate and regular rhythm.   No murmur heard. Regular rate and rhythm. Heart sounds are normal. No murmur.  Pulmonary/Chest: Effort normal and breath sounds normal. No accessory muscle usage. No respiratory distress. He has no decreased breath sounds. He has no wheezes.  Lungs clear to auscultation bilaterally. No wheezing or decreased breath sounds.  Abdominal: Soft. He exhibits no distension. There is no tenderness. There is no rebound and no guarding.  No abdominal tenderness to palpation. Obese abdomen. Soft abdomen. No guarding or rebound.  Musculoskeletal: Normal range of motion.  Neurological: He is alert and oriented to person, place, and time. Coordination normal.  Skin: Skin is warm.  Psychiatric: He has a normal mood and affect. His behavior is normal.  Nursing note and vitals reviewed.   ED Course  Procedures  DIAGNOSTIC STUDIES: Oxygen Saturation is 100% on RA, normal by my interpretation.    COORDINATION OF CARE: 8:34 PM Discussed treatment plan with pt at bedside and pt agreed to plan.   Labs Review Labs Reviewed  COMPREHENSIVE METABOLIC PANEL - Abnormal; Notable for the  following:    Sodium 129 (*)    Chloride 99 (*)    CO2 15 (*)    Glucose, Bld 464 (*)    Creatinine, Ser 1.47 (*)    Calcium 8.7 (*)    Albumin 3.4 (*)    All other components within normal limits  CBG MONITORING, ED - Abnormal; Notable for the following:    Glucose-Capillary 370 (*)    All other components within normal limits  CBG MONITORING, ED - Abnormal; Notable for the following:     Glucose-Capillary 432 (*)    All other components within normal limits  CBG MONITORING, ED - Abnormal; Notable for the following:    Glucose-Capillary 446 (*)    All other components within normal limits  CBC WITH DIFFERENTIAL/PLATELET    MDM   Final diagnoses:  Hyperglycemia  Patient presents for new diagnosis of hyperglycemia from urgent care earlier today. His CBG was 370 on arrival. The patient drank a soda between labs and his following CBG was 432. His anion gap is 15 and he is not currently in DKA. He is symptomatic with polydipsia and polyuria. He is well-appearing and in no acute distress. I discussed this patient with Dr. Silverio Lay who has seen and evaluated the patient. He agrees with admission since the patient does not have a primary care physician and will need further evaluation. The patient agreed to admission. He was started on a glucose stabilizer. I spoke to the hospitalist who agreed with admission to telemetry. Medications  insulin regular (NOVOLIN R,HUMULIN R) 250 Units in sodium chloride 0.9 % 250 mL (1 Units/mL) infusion (not administered)  sodium chloride 0.9 % bolus 1,000 mL (0 mLs Intravenous Stopped 09/14/15 2130)  sodium chloride 0.9 % bolus 1,000 mL (1,000 mLs Intravenous New Bag/Given 09/14/15 2150)   Filed Vitals:   09/14/15 2115  BP: 111/78  Pulse: 112  Temp: 97.5 F (36.4 C)  Resp: 18  I personally performed the services described in this documentation, which was scribed in my presence. The recorded information has been reviewed and is accurate.   Catha Gosselin, PA-C 09/14/15 2301  Richardean Canal, MD 09/14/15 2351

## 2015-09-14 NOTE — ED Notes (Signed)
Pt in c/o generalized fatigue for the last two months, denies pain, states some days are worse than other, went to urgent care for this and told he was dehydrated, sent here for further evaluation

## 2015-09-14 NOTE — ED Notes (Signed)
Patient complains of a 2 month history of feeling tired, leg cramps and frequent urination.  Patient denies any history of diabetes

## 2015-09-15 ENCOUNTER — Encounter (HOSPITAL_COMMUNITY): Payer: Self-pay | Admitting: *Deleted

## 2015-09-15 DIAGNOSIS — R739 Hyperglycemia, unspecified: Secondary | ICD-10-CM

## 2015-09-15 DIAGNOSIS — E860 Dehydration: Secondary | ICD-10-CM | POA: Diagnosis present

## 2015-09-15 DIAGNOSIS — E119 Type 2 diabetes mellitus without complications: Secondary | ICD-10-CM

## 2015-09-15 DIAGNOSIS — E871 Hypo-osmolality and hyponatremia: Secondary | ICD-10-CM | POA: Diagnosis present

## 2015-09-15 DIAGNOSIS — E109 Type 1 diabetes mellitus without complications: Secondary | ICD-10-CM

## 2015-09-15 LAB — GLUCOSE, CAPILLARY
GLUCOSE-CAPILLARY: 266 mg/dL — AB (ref 65–99)
GLUCOSE-CAPILLARY: 238 mg/dL — AB (ref 65–99)
GLUCOSE-CAPILLARY: 177 mg/dL — AB (ref 65–99)
GLUCOSE-CAPILLARY: 189 mg/dL — AB (ref 65–99)
GLUCOSE-CAPILLARY: 362 mg/dL — AB (ref 65–99)
Glucose-Capillary: 259 mg/dL — ABNORMAL HIGH (ref 65–99)
Glucose-Capillary: 274 mg/dL — ABNORMAL HIGH (ref 65–99)
Glucose-Capillary: 308 mg/dL — ABNORMAL HIGH (ref 65–99)
Glucose-Capillary: 342 mg/dL — ABNORMAL HIGH (ref 65–99)
Glucose-Capillary: 439 mg/dL — ABNORMAL HIGH (ref 65–99)
Glucose-Capillary: 466 mg/dL — ABNORMAL HIGH (ref 65–99)

## 2015-09-15 LAB — COMPREHENSIVE METABOLIC PANEL
ALBUMIN: 3.3 g/dL — AB (ref 3.5–5.0)
ALK PHOS: 56 U/L (ref 38–126)
ALT: 47 U/L (ref 17–63)
AST: 27 U/L (ref 15–41)
Anion gap: 9 (ref 5–15)
BUN: 10 mg/dL (ref 6–20)
CALCIUM: 8.6 mg/dL — AB (ref 8.9–10.3)
CO2: 22 mmol/L (ref 22–32)
CREATININE: 1.32 mg/dL — AB (ref 0.61–1.24)
Chloride: 101 mmol/L (ref 101–111)
GFR calc Af Amer: >60 mL/min (ref >60)
GFR calc non Af Amer: >60 mL/min (ref >60)
GLUCOSE: 271 mg/dL — AB (ref 65–99)
Potassium: 3.9 mmol/L (ref 3.5–5.1)
SODIUM: 132 mmol/L — AB (ref 135–145)
Total Bilirubin: 0.9 mg/dL (ref 0.3–1.2)
Total Protein: 6.9 g/dL (ref 6.5–8.1)

## 2015-09-15 LAB — TSH: TSH: 5.097 u[IU]/mL — ABNORMAL HIGH (ref 0.350–4.500)

## 2015-09-15 MED ORDER — ONDANSETRON HCL 4 MG PO TABS: 4.0000 mg | ORAL_TABLET | Freq: Four times a day (QID) | ORAL | Status: DC | PRN

## 2015-09-15 MED ORDER — INSULIN STARTER KIT- PEN NEEDLES (ENGLISH)
1.0000 | Freq: Once | Status: AC
  Administered 2015-09-15: 1
  Filled 2015-09-15: qty 1

## 2015-09-15 MED ORDER — ENOXAPARIN SODIUM 40 MG/0.4ML ~~LOC~~ SOLN: 40.0000 mg | SUBCUTANEOUS | Status: DC

## 2015-09-15 MED ORDER — INSULIN GLARGINE 100 UNIT/ML ~~LOC~~ SOLN
10.0000 [IU] | Freq: Every day | SUBCUTANEOUS | Status: DC
  Administered 2015-09-15: 10 [IU] via SUBCUTANEOUS
  Filled 2015-09-15: qty 0.1

## 2015-09-15 MED ORDER — PANTOPRAZOLE SODIUM 40 MG PO TBEC
40.0000 mg | DELAYED_RELEASE_TABLET | Freq: Every day | ORAL | Status: DC
  Administered 2015-09-15 – 2015-09-16 (×2): 40 mg via ORAL
  Filled 2015-09-15 (×2): qty 1

## 2015-09-15 MED ORDER — INFLUENZA VAC SPLIT QUAD 0.5 ML IM SUSY
0.5000 mL | PREFILLED_SYRINGE | INTRAMUSCULAR | Status: AC
  Administered 2015-09-16: 0.5 mL via INTRAMUSCULAR
  Filled 2015-09-15 (×2): qty 0.5

## 2015-09-15 MED ORDER — HYDROCODONE-ACETAMINOPHEN 5-325 MG PO TABS: 1.0000 | ORAL_TABLET | ORAL | Status: DC | PRN

## 2015-09-15 MED ORDER — ACETAMINOPHEN 650 MG RE SUPP: 650.0000 mg | Freq: Four times a day (QID) | RECTAL | Status: DC | PRN

## 2015-09-15 MED ORDER — ONDANSETRON HCL 4 MG/2ML IJ SOLN: 4.0000 mg | Freq: Four times a day (QID) | INTRAMUSCULAR | Status: DC | PRN

## 2015-09-15 MED ORDER — ZOLPIDEM TARTRATE 5 MG PO TABS: 5.0000 mg | ORAL_TABLET | Freq: Every evening | ORAL | Status: DC | PRN

## 2015-09-15 MED ORDER — LIVING WELL WITH DIABETES BOOK
Freq: Once | Status: AC
  Administered 2015-09-15: 1
  Filled 2015-09-15: qty 1

## 2015-09-15 MED ORDER — INSULIN ASPART 100 UNIT/ML ~~LOC~~ SOLN
0.0000 [IU] | Freq: Three times a day (TID) | SUBCUTANEOUS | Status: DC
  Administered 2015-09-15: 11 [IU] via SUBCUTANEOUS
  Administered 2015-09-15: 15 [IU] via SUBCUTANEOUS
  Administered 2015-09-16: 7 [IU] via SUBCUTANEOUS

## 2015-09-15 MED ORDER — ACETAMINOPHEN 325 MG PO TABS: 650.0000 mg | ORAL_TABLET | Freq: Four times a day (QID) | ORAL | Status: DC | PRN

## 2015-09-15 MED ORDER — SODIUM CHLORIDE 0.9 % IV SOLN
INTRAVENOUS | Status: DC
  Administered 2015-09-15 (×2): via INTRAVENOUS

## 2015-09-15 MED ORDER — INSULIN GLARGINE 100 UNIT/ML ~~LOC~~ SOLN
20.0000 [IU] | Freq: Every day | SUBCUTANEOUS | Status: DC
  Administered 2015-09-15: 20 [IU] via SUBCUTANEOUS
  Filled 2015-09-15 (×2): qty 0.2

## 2015-09-15 MED ORDER — INSULIN ASPART 100 UNIT/ML ~~LOC~~ SOLN: 0.0000 [IU] | Freq: Three times a day (TID) | SUBCUTANEOUS | Status: DC

## 2015-09-15 MED ORDER — ENOXAPARIN SODIUM 80 MG/0.8ML ~~LOC~~ SOLN
80.0000 mg | SUBCUTANEOUS | Status: DC
  Administered 2015-09-15: 80 mg via SUBCUTANEOUS
  Filled 2015-09-15 (×2): qty 0.8

## 2015-09-15 MED ORDER — SENNOSIDES-DOCUSATE SODIUM 8.6-50 MG PO TABS
2.0000 | ORAL_TABLET | Freq: Every evening | ORAL | Status: DC | PRN
  Filled 2015-09-15: qty 2

## 2015-09-15 MED ORDER — INSULIN STARTER KIT- SYRINGES (ENGLISH)
1.0000 | Freq: Once | Status: AC
  Administered 2015-09-15: 1
  Filled 2015-09-15: qty 1

## 2015-09-15 MED ORDER — INSULIN ASPART 100 UNIT/ML ~~LOC~~ SOLN
0.0000 [IU] | Freq: Every day | SUBCUTANEOUS | Status: DC
  Administered 2015-09-15: 5 [IU] via SUBCUTANEOUS

## 2015-09-15 NOTE — Progress Notes (Signed)
Inpatient Diabetes Program Recommendations  AACE/ADA: New Consensus Statement on Inpatient Glycemic Control (2015)  Target Ranges:  Prepandial:   less than 140 mg/dL      Peak postprandial:   less than 180 mg/dL (1-2 hours)      Critically ill patients:  140 - 180 mg/dL   Review of Glycemic Control  Diabetes history: None Current orders for Inpatient glycemic control: Lantus 20 units Q 24hrs, Novolog Resistant TID + HS  Agree with insulin changes this am. A1c pending. Will see patient today. RNs to provide ongoing basic DM education at bedside with this patient and engage patient to actively check blood glucose and administer insulin injections. Have ordered educational booklet, insulin starter kit, and DM videos. Will watch trends today and while inpatient.  Thanks,  Tama Headings RN, MSN, [facility] Inpatient Diabetes Coordinator Team Pager [phone] (8a-5p)

## 2015-09-15 NOTE — Progress Notes (Signed)
Pt arrived to 5W11 from ED. Pt walked from stretcher to bed with no assist. Vitals taken and stable.  Telemetry monitor connected. Pt on Glucostabilizer.

## 2015-09-15 NOTE — Progress Notes (Signed)
  RD consulted for nutrition education regarding diabetes.   No results found for: HGBA1C  Pt was sleeping soundly at time of visit and did not arouse to name being called. RD left "Plate Method" handout at bedside table next to "Living Well with Diabetes" handout provided by RN. DM coordinator has ordered videos and bedside education.  Body mass index is 49.13 kg/(m^2). Pt meets criteria for extreme obesity, class III based on current BMI.  Current diet order is Carb Modified, patient is consuming approximately 100% of meals at this time. Labs and medications reviewed. No further nutrition interventions warranted at this time. RD contact information provided. If additional nutrition issues arise, please re-consult RD. Will follow-up with pt on Monday, 09/18/15, if pt is still in the hospital  Jenifer A. Mayford Knife, RD, LDN, CDE Pager: [phone] After hours Pager: [phone]

## 2015-09-15 NOTE — Care Management Note (Signed)
Case Management Note  Patient Details  Name: Calvin Huber MRN: [id number] Date of Birth: 03-27-1991  Subjective/Objective:                  Date: 09-15-15 Friday Spoke with patient at the bedside. Introduced self as Sports coach and explained role in discharge planning and how to be reached. Verified patient lives in [place] with mother. Verified patient anticipates to go home with mother at time of discharge. Patient has  No DME. Expressed potential need for no other DME. Patient denied needing help with their medication. Patient drives MD appointments. Verified patient has no PCP, offered to make appointment at [facility] patient declined. Patient has [facility] and was given [facility] connect phone number to find a physician who is accepting patient with his insurance. Patient encouraged to call today and establish care with provider. CM stressed importance of having a PCP with a chronic illness such as DM2.  Patient states they currently receive HH services through no one.    Plan: CM will continue to follow for discharge planning and [facility] resources.   Lawerance Sabal RN BSN CM [phone]   Action/Plan:   Expected Discharge Date:                  Expected Discharge Plan:  Home/Self Care  In-House Referral:     Discharge planning Services  CM Consult  Post Acute Care Choice:    Choice offered to:     DME Arranged:    DME Agency:     HH Arranged:    HH Agency:     Status of Service:  In process, will continue to follow  Medicare Important Message Given:    Date Medicare IM Given:    Medicare IM give by:    Date Additional Medicare IM Given:    Additional Medicare Important Message give by:     If discussed at Long Length of Stay Meetings, dates discussed:    Additional Comments:  Lawerance Sabal, RN 09/15/2015, 11:15 AM

## 2015-09-15 NOTE — Progress Notes (Signed)
[facility] PROGRESS NOTE  Gianpaolo Mindel Loney MVH:[id number] DOB: 1991/06/02 DOA: 09/14/2015 PCP: No primary care provider on file.  Assessment/Plan: 1. Hyperglycemia/New DM -Insulin gtt stopped this am, Will give 20 more units of Lantus, got 10units earlier this am -continue IVF -FU Hbaic -SSI -Rd consult -DM coordinators help appreciated  2. AKI -likely due to dehydration, volume depletion from above -hydrate, monitor  3. Obesity -lifestyle modification  4. Hyponatremia -due to dehydration, improving with hydration'  DVT proph:lovenox  Code Status: Full Code Family Communication: none at bedside Disposition Plan:  Home tomorrow   HPI/Subjective: Feels better  Objective: Filed Vitals:   09/15/15 0619  BP: 125/76  Pulse: 98  Temp: 98.7 F (37.1 C)  Resp: 20    Intake/Output Summary (Last 24 hours) at 09/15/15 1305 Last data filed at 09/15/15 0950  Gross per 24 hour  Intake 1832.92 ml  Output      0 ml  Net 1832.92 ml   Filed Weights   09/15/15 0017  Weight: 178.3 kg (393 lb 1.3 oz)    Exam:   General:  AAOx3, pleasant  Cardiovascular: S1S2/RRR  Respiratory: CTAB  Abdomen: obese, soft, NT, BS present  Musculoskeletal: no edema c/c   Data Reviewed: Basic Metabolic Panel:  Recent Labs Lab 09/14/15 1514 09/14/15 2131 09/15/15 0452  NA 131* 129* 132*  K 4.9 4.4 3.9  CL 99* 99* 101  CO2  --  15* 22  GLUCOSE 485* 464* 271*  BUN CREATININE 1.10 1.47* 1.32*  CALCIUM  --  8.7* 8.6*   Liver Function Tests:  Recent Labs Lab 09/14/15 2131 09/15/15 0452  AST 32 27  ALT 51 47  ALKPHOS 58 56  BILITOT 1.2 0.9  PROT 7.2 6.9  ALBUMIN 3.4* 3.3*   No results for input(s): LIPASE, AMYLASE in the last 168 hours. No results for input(s): AMMONIA in the last 168 hours. CBC:  Recent Labs Lab 09/14/15 1514 09/14/15 2131  WBC  --  6.1  NEUTROABS  --  3.5  HGB 19.0* 15.6  HCT 56.0* 45.6  MCV  --  80.6  PLT  --  185    Cardiac Enzymes: No results for input(s): CKTOTAL, CKMB, CKMBINDEX, TROPONINI in the last 168 hours. BNP (last 3 results) No results for input(s): BNP in the last 8760 hours.  ProBNP (last 3 results) No results for input(s): PROBNP in the last 8760 hours.  CBG:  Recent Labs Lab 09/15/15 0500 09/15/15 0616 09/15/15 0752 09/15/15 0845 09/15/15 1221  GLUCAP 259* 238* 177* 189* 274*    No results found for this or any previous visit (from the past 240 hour(s)).   Studies: No results found.  Scheduled Meds: . enoxaparin (LOVENOX) injection  80 mg Subcutaneous Q24H  . [START ON 09/16/2015] Influenza vac split quadrivalent PF  0.5 mL Intramuscular Tomorrow-1000  . insulin aspart  0-20 Units Subcutaneous TID WC  . insulin aspart  0-5 Units Subcutaneous QHS  . insulin glargine  20 Units Subcutaneous Daily  . pantoprazole  40 mg Oral Daily   Continuous Infusions: . sodium chloride 75 mL/hr at 09/15/15 1102   Antibiotics Given (last 72 hours)    None      Active Problems:   Hyperglycemia   New onset type 1 diabetes mellitus, uncontrolled    Time spent:    [facility]  [facility] Pager [phone]. If 7PM-7AM, please contact night-coverage at [email or web address], password [facility] 09/15/2015, 1:05 PM  LOS: 1 day

## 2015-09-15 NOTE — Progress Notes (Signed)
Inpatient Diabetes Program Recommendations  AACE/ADA: New Consensus Statement on Inpatient Glycemic Control (2015)  Target Ranges:  Prepandial:   less than 140 mg/dL      Peak postprandial:   less than 180 mg/dL (1-2 hours)      Critically ill patients:  140 - 180 mg/dL   Spoke with pt about new diabetes diagnosis. Discussed A1C is pending and explained what an A1C is, basic pathophysiology of DM Type 2, basic home care, importance of checking CBGs and maintaining good CBG control to prevent long-term and short-term complications. Reviewed signs and symptoms of hyperglycemia and hypoglycemia along with treatment for both. Gave patient Diabetes Meal Planning Guide and reviewed daily Carbohydrate requirements along with portion sizes.  Educated patient on insulin pen use at home. Patient reports that it is covered by his insurance. Reviewed contents of insulin flexpen starter kit. Reviewed all steps if insulin pen including attachment of needle, 2-unit air shot, dialing up dose, giving injection, removing needle, disposal of sharps, storage of unused insulin, disposal of insulin etc. Patient able to provide successful return demonstration. Also reviewed troubleshooting with insulin pen.   Due to Glucose on admission expect the patient will need to be on Dual therapy with once Daily basal insulin and possible oral agent such as Metformin on discharged. At time of discharge, MD, please consider ordering patient Rxs for insulin pen, insulin pen needles (order # [id number]), Glucose Meter Kit (order # [id number]).  Patient to view patient educational videos before discharge and review nutritional information provided.  Thanks,  Tama Headings RN, MSN, [facility] Inpatient Diabetes Coordinator Team Pager [phone] (8a-5p)

## 2015-09-16 DIAGNOSIS — R739 Hyperglycemia, unspecified: Secondary | ICD-10-CM | POA: Diagnosis not present

## 2015-09-16 DIAGNOSIS — E871 Hypo-osmolality and hyponatremia: Secondary | ICD-10-CM | POA: Diagnosis not present

## 2015-09-16 DIAGNOSIS — E860 Dehydration: Secondary | ICD-10-CM

## 2015-09-16 DIAGNOSIS — E119 Type 2 diabetes mellitus without complications: Secondary | ICD-10-CM | POA: Diagnosis not present

## 2015-09-16 LAB — BASIC METABOLIC PANEL
Anion gap: 13 (ref 5–15)
BUN: 7 mg/dL (ref 6–20)
CHLORIDE: 102 mmol/L (ref 101–111)
CO2: 17 mmol/L — ABNORMAL LOW (ref 22–32)
CREATININE: 1.14 mg/dL (ref 0.61–1.24)
Calcium: 8.5 mg/dL — ABNORMAL LOW (ref 8.9–10.3)
Glucose, Bld: 337 mg/dL — ABNORMAL HIGH (ref 65–99)
POTASSIUM: 4 mmol/L (ref 3.5–5.1)
SODIUM: 132 mmol/L — AB (ref 135–145)

## 2015-09-16 LAB — GLUCOSE, CAPILLARY: GLUCOSE-CAPILLARY: 247 mg/dL — AB (ref 65–99)

## 2015-09-16 LAB — HEMOGLOBIN A1C
Hgb A1c MFr Bld: 14.6 % — ABNORMAL HIGH (ref 4.8–5.6)
Mean Plasma Glucose: 372 mg/dL

## 2015-09-16 MED ORDER — INSULIN GLARGINE 100 UNIT/ML ~~LOC~~ SOLN
40.0000 [IU] | Freq: Every day | SUBCUTANEOUS | Status: DC
  Administered 2015-09-16: 40 [IU] via SUBCUTANEOUS
  Filled 2015-09-16: qty 0.4

## 2015-09-16 MED ORDER — INSULIN GLARGINE 100 UNIT/ML SOLOSTAR PEN: 45.0000 [IU] | PEN_INJECTOR | Freq: Every day | SUBCUTANEOUS | Status: DC

## 2015-09-16 MED ORDER — BLOOD GLUCOSE METER KIT: PACK | Status: DC

## 2015-09-16 MED ORDER — METFORMIN HCL 500 MG PO TABS: 500.0000 mg | ORAL_TABLET | Freq: Two times a day (BID) | ORAL | Status: DC

## 2015-09-16 MED ORDER — INSULIN PEN NEEDLE 31G X 5 MM MISC: 1.0000 | Freq: Two times a day (BID) | Status: DC

## 2015-09-16 NOTE — Progress Notes (Signed)
Patient discharge teaching given, including activity, diet, follow-up appoints, and medications. Patient verbalized understanding of all discharge instructions. IV access was d/c'd. Vitals are stable. Skin is intact except as charted in most recent assessments. Pt to be escorted out by NT, to be driven home by family. 

## 2015-09-19 NOTE — Discharge Summary (Signed)
Physician Discharge Summary  Calvin Huber PTW:[id number] DOB: May 05, 1991 DOA: 09/14/2015  PCP: No primary care provider on file.  Admit date: 09/14/2015 Discharge date: 09/16/2015  Time spent: 45 minutes  Recommendations for Outpatient Follow-up:  1. PCP/New  in 1 week 2. Dr.Jeffrey Buddy Duty in 1 month  Discharge Diagnoses:  Principal Problem:   Diabetes mellitus, new onset Active Problems:   Hyperglycemia   Dehydration   Hyponatremia   Obesity   Discharge Condition: stable  Diet recommendation: Diabetic  Filed Weights   09/15/15 0017  Weight: 178.3 kg (393 lb 1.3 oz)    History of present illness:  Chief complaint: fatigue  HPI: Calvin Huber is a 24 y.o. male, presented to our emergency room with worsening fatigue in the last few days. The patient was found to be hyperglycemic. Patient has no history of diabetes hypertension or any other medical illness     Hospital Course:  1. Hyperglycemia/New DM -started on Insulin gtt, IVF, then transitioned to long acting Insulin, started on lantus and dose increased -discharged home on Insulin 45units and metformin -Hbaic was 14.5 -was seen by Diabetic coordinator and Dietician in consult -advised to FU with Dr.Kerr in 1 month -advised weight loss and lifestyle modification -will need meds titrated further as outpatient  2. AKI -likely due to dehydration, volume depletion from above -resolved with hydration  3. Obesity -lifestyle modification  4. Hyponatremia -due to dehydration, improved with hydration   Discharge Exam: Filed Vitals:   09/16/15 0545  BP: 130/76  Pulse: 100  Temp: 98 F (36.7 C)  Resp: 20    General: AAOx3 Cardiovascular: S1S2/RRR Respiratory: CTAB  Discharge Instructions   Discharge Instructions    Diet Carb Modified    Complete by:  As directed      Increase activity slowly    Complete by:  As directed           Discharge Medication List as of 09/16/2015 11:30 AM    START  taking these medications   Details  blood glucose meter kit and supplies Dispense based on patient and insurance preference. Use up to four times daily as directed. (FOR ICD-9 250.00, 250.01)., Normal    Insulin Glargine (LANTUS) 100 UNIT/ML Solostar Pen Inject 45 Units into the skin daily with breakfast., Starting 09/16/2015, Until Discontinued, Print    Insulin Pen Needle 31G X 5 MM MISC 1 each by Does not apply route 2 (two) times daily before a meal., Starting 09/16/2015, Until Discontinued, Normal    metFORMIN (GLUCOPHAGE) 500 MG tablet Take 1 tablet (500 mg total) by mouth 2 (two) times daily with a meal., Starting 09/16/2015, Until Discontinued, Print      CONTINUE these medications which have NOT CHANGED   Details  omeprazole (PRILOSEC) 40 MG capsule Take 1 capsule (40 mg total) by mouth daily., Starting 08/25/2015, Until Discontinued, Print       No Known Allergies Follow-up Information    Follow up with KERR,JEFFREY, MD. Schedule an appointment as soon as possible for a visit in 1 month.   Specialty:  Endocrinology   Contact information:   [address] [place] [address] [phone]       Follow up with New PCP. Schedule an appointment as soon as possible for a visit in 1 week.       The results of significant diagnostics from this hospitalization (including imaging, microbiology, ancillary and laboratory) are listed below for reference.    Significant Diagnostic Studies: No  results found.  Microbiology: No results found for this or any previous visit (from the past 240 hour(s)).   Labs: Basic Metabolic Panel:  Recent Labs Lab 09/14/15 1514 09/14/15 2131 09/15/15 0452 09/16/15 0955  NA 131* 129* 132* 132*  K 4.9 4.4 3.9 4.0  CL 99* 99* 101 102  CO2  --  15* 22 17*  GLUCOSE 485* 464* 271* 337*  BUN 16 12 10 7   CREATININE 1.10 1.47* 1.32* 1.14  CALCIUM  --  8.7* 8.6* 8.5*   Liver Function Tests:  Recent Labs Lab 09/14/15 2131  09/15/15 0452  AST 32 27  ALT 51 47  ALKPHOS 58 56  BILITOT 1.2 0.9  PROT 7.2 6.9  ALBUMIN 3.4* 3.3*   No results for input(s): LIPASE, AMYLASE in the last 168 hours. No results for input(s): AMMONIA in the last 168 hours. CBC:  Recent Labs Lab 09/14/15 1514 09/14/15 2131  WBC  --  6.1  NEUTROABS  --  3.5  HGB 19.0* 15.6  HCT 56.0* 45.6  MCV  --  80.6  PLT  --  185   Cardiac Enzymes: No results for input(s): CKTOTAL, CKMB, CKMBINDEX, TROPONINI in the last 168 hours. BNP: BNP (last 3 results) No results for input(s): BNP in the last 8760 hours.  ProBNP (last 3 results) No results for input(s): PROBNP in the last 8760 hours.  CBG:  Recent Labs Lab 09/15/15 0845 09/15/15 1221 09/15/15 1746 09/15/15 2140 09/16/15 0800  GLUCAP 189* 274* 308* 362* 247*       Signed:  JOSEPH,PREETHA  [facility] 09/19/2015, 1:59 PM

## 2016-06-14 ENCOUNTER — Emergency Department (HOSPITAL_COMMUNITY)
Admission: EM | Admit: 2016-06-14 | Discharge: 2016-06-14 | Disposition: A | Discharge status: Home / Self Care | Payer: Commercial Managed Care - PPO | Attending: Emergency Medicine | Admitting: Emergency Medicine

## 2016-06-14 ENCOUNTER — Ambulatory Visit (INDEPENDENT_AMBULATORY_CARE_PROVIDER_SITE_OTHER): Payer: Commercial Managed Care - PPO | Admitting: Physician Assistant

## 2016-06-14 ENCOUNTER — Emergency Department (HOSPITAL_COMMUNITY): Payer: Commercial Managed Care - PPO

## 2016-06-14 ENCOUNTER — Encounter (HOSPITAL_COMMUNITY): Payer: Self-pay | Admitting: *Deleted

## 2016-06-14 VITALS — BP 128/80 | HR 95 | Temp 98.8°F | Resp 185 | Ht 73.5 in | Wt 331.0 lb

## 2016-06-14 DIAGNOSIS — Z1329 Encounter for screening for other suspected endocrine disorder: Secondary | ICD-10-CM | POA: Diagnosis not present

## 2016-06-14 DIAGNOSIS — Z0000 Encounter for general adult medical examination without abnormal findings: Secondary | ICD-10-CM | POA: Diagnosis not present

## 2016-06-14 DIAGNOSIS — G8929 Other chronic pain: Secondary | ICD-10-CM

## 2016-06-14 DIAGNOSIS — R109 Unspecified abdominal pain: Secondary | ICD-10-CM | POA: Diagnosis not present

## 2016-06-14 DIAGNOSIS — R1031 Right lower quadrant pain: Secondary | ICD-10-CM

## 2016-06-14 DIAGNOSIS — Z130 Encounter for screening for diseases of the blood and blood-forming organs and certain disorders involving the immune mechanism: Secondary | ICD-10-CM | POA: Diagnosis not present

## 2016-06-14 DIAGNOSIS — Z7984 Long term (current) use of oral hypoglycemic drugs: Secondary | ICD-10-CM | POA: Diagnosis not present

## 2016-06-14 DIAGNOSIS — Z113 Encounter for screening for infections with a predominantly sexual mode of transmission: Secondary | ICD-10-CM | POA: Diagnosis not present

## 2016-06-14 DIAGNOSIS — E1165 Type 2 diabetes mellitus with hyperglycemia: Secondary | ICD-10-CM | POA: Diagnosis not present

## 2016-06-14 DIAGNOSIS — R3129 Other microscopic hematuria: Secondary | ICD-10-CM | POA: Diagnosis not present

## 2016-06-14 DIAGNOSIS — Z794 Long term (current) use of insulin: Secondary | ICD-10-CM | POA: Insufficient documentation

## 2016-06-14 DIAGNOSIS — R739 Hyperglycemia, unspecified: Secondary | ICD-10-CM

## 2016-06-14 HISTORY — DX: Type 2 diabetes mellitus without complications: E11.9

## 2016-06-14 LAB — POCT URINALYSIS DIP (MANUAL ENTRY)
Bilirubin, UA: NEGATIVE
Glucose, UA: >=1000 — AB
Ketones, POC UA: NEGATIVE
Nitrite, UA: NEGATIVE
PH UA: 5.5
PROTEIN UA: NEGATIVE
UROBILINOGEN UA: 0.2

## 2016-06-14 LAB — POCT CBC
Granulocyte percent: 71.2 %G (ref 37–80)
HCT, POC: 43 % — AB (ref 43.5–53.7)
Hemoglobin: 14.6 g/dL (ref 14.1–18.1)
Lymph, poc: 2.5 (ref 0.6–3.4)
MCH, POC: 27.4 pg (ref 27–31.2)
MCHC: 33.9 g/dL (ref 31.8–35.4)
MCV: 80.9 fL (ref 80–97)
MID (CBC): 0.4 (ref 0–0.9)
MPV: 7.9 fL (ref 0–99.8)
PLATELET COUNT, POC: 252 10*3/uL (ref 142–424)
POC Granulocyte: 7.1 — AB (ref 2–6.9)
POC LYMPH %: 25.2 % (ref 10–50)
POC MID %: 3.6 %M (ref 0–12)
RBC: 5.32 M/uL (ref 4.69–6.13)
RDW, POC: 12.3 %
WBC: 10 10*3/uL (ref 4.6–10.2)

## 2016-06-14 LAB — URINE MICROSCOPIC-ADD ON

## 2016-06-14 LAB — POCT GLYCOSYLATED HEMOGLOBIN (HGB A1C): Hemoglobin A1C: >14

## 2016-06-14 LAB — BASIC METABOLIC PANEL
Anion gap: 11 (ref 5–15)
BUN: 13 mg/dL (ref 6–20)
CO2: 24 mmol/L (ref 22–32)
CREATININE: 0.94 mg/dL (ref 0.61–1.24)
Calcium: 9.5 mg/dL (ref 8.9–10.3)
Chloride: 98 mmol/L — ABNORMAL LOW (ref 101–111)
GFR calc Af Amer: >60 mL/min (ref >60)
GFR calc non Af Amer: >60 mL/min (ref >60)
Glucose, Bld: 473 mg/dL — ABNORMAL HIGH (ref 65–99)
Potassium: 4.2 mmol/L (ref 3.5–5.1)
SODIUM: 133 mmol/L — AB (ref 135–145)

## 2016-06-14 LAB — LIPASE, BLOOD: Lipase: 20 U/L (ref 11–51)

## 2016-06-14 LAB — URINALYSIS, ROUTINE W REFLEX MICROSCOPIC
Bilirubin Urine: NEGATIVE
Glucose, UA: >1000 mg/dL — AB
Ketones, ur: 40 mg/dL — AB
NITRITE: NEGATIVE
PROTEIN: NEGATIVE mg/dL
SPECIFIC GRAVITY, URINE: 1.035 — AB (ref 1.005–1.030)
pH: 5.5 (ref 5.0–8.0)

## 2016-06-14 LAB — CBC
HEMATOCRIT: 42.9 % (ref 39.0–52.0)
Hemoglobin: 14.2 g/dL (ref 13.0–17.0)
MCH: 26.7 pg (ref 26.0–34.0)
MCHC: 33.1 g/dL (ref 30.0–36.0)
MCV: 80.6 fL (ref 78.0–100.0)
PLATELETS: 277 10*3/uL (ref 150–400)
RBC: 5.32 MIL/uL (ref 4.22–5.81)
RDW: 12.8 % (ref 11.5–15.5)
WBC: 10.4 10*3/uL (ref 4.0–10.5)

## 2016-06-14 LAB — POC MICROSCOPIC URINALYSIS (UMFC): MUCUS RE: ABSENT

## 2016-06-14 LAB — CBG MONITORING, ED
GLUCOSE-CAPILLARY: 302 mg/dL — AB (ref 65–99)
Glucose-Capillary: 391 mg/dL — ABNORMAL HIGH (ref 65–99)

## 2016-06-14 LAB — GLUCOSE, POCT (MANUAL RESULT ENTRY): POC Glucose: >444 mg/dl (ref 70–99)

## 2016-06-14 MED ORDER — INSULIN ASPART 100 UNIT/ML ~~LOC~~ SOLN
10.0000 [IU] | Freq: Once | SUBCUTANEOUS | Status: AC
  Administered 2016-06-14: 10 [IU] via INTRAVENOUS
  Filled 2016-06-14: qty 1

## 2016-06-14 MED ORDER — SODIUM CHLORIDE 0.9 % IV BOLUS (SEPSIS)
1000.0000 mL | Freq: Once | INTRAVENOUS | Status: AC
  Administered 2016-06-14: 1000 mL via INTRAVENOUS

## 2016-06-14 NOTE — ED Notes (Signed)
CBG 302. Dr notified.

## 2016-06-14 NOTE — ED Provider Notes (Signed)
CSN: [id number]     Arrival date & time 06/14/16  1334 History   First MD Initiated Contact with Patient 06/14/16 1505     Chief Complaint  Patient presents with  . Hyperglycemia     (Consider location/radiation/quality/duration/timing/severity/associated sxs/prior Treatment) HPI Comments: Patient is a 25 year old male with history of insulin-dependent diabetes. He presents for evaluation of elevated blood sugar, right flank pain, and microscopic hematuria diagnosed at urgent care this morning. He presented there initially for his yearly physical, however was found to have high blood sugar and the above complaints. He denies any fevers or chills. He denies any nausea or vomiting.  Patient is a 25 y.o. male presenting with hyperglycemia. The history is provided by the patient.  Hyperglycemia Severity:  Moderate Onset quality:  Gradual Timing:  Constant Progression:  Worsening Chronicity:  New   Past Medical History  Diagnosis Date  . Diabetes mellitus without complication [facility])    Past Surgical History  Procedure Laterality Date  . Knee surgery     Family History  Problem Relation Age of Onset  . Diabetes Mother   . Diabetes Sister    Social History  Substance Use Topics  . Smoking status: Never Smoker   . Smokeless tobacco: Never Used  . Alcohol Use: No    Review of Systems  All other systems reviewed and are negative.     Allergies  Review of patient's allergies indicates no known allergies.  Home Medications   Prior to Admission medications   Medication Sig Start Date End Date Taking? Authorizing Provider  blood glucose meter kit and supplies Dispense based on patient and insurance preference. Use up to four times daily as directed. (FOR ICD-9 250.00, 250.01). 09/16/15   Domenic Polite, MD  Insulin Glargine (LANTUS) 100 UNIT/ML Solostar Pen Inject 45 Units into the skin daily with breakfast. 09/16/15   Domenic Polite, MD  Insulin Pen Needle 31G X 5 MM MISC 1  each by Does not apply route 2 (two) times daily before a meal. 09/16/15   Domenic Polite, MD  metFORMIN (GLUCOPHAGE) 500 MG tablet Take 1 tablet (500 mg total) by mouth 2 (two) times daily with a meal. Patient not taking: Reported on 06/14/2016 09/16/15   Domenic Polite, MD  omeprazole (PRILOSEC) 40 MG capsule Take 1 capsule (40 mg total) by mouth daily. 08/25/15   Melony Overly, MD   Pulse 92  Temp(Src) 98.9 F (37.2 C) (Oral)  Resp 14  Ht _0  (1.905 m)  Wt 323 lb 1 oz (146.54 kg)  BMI 40.38 kg/m2  SpO2 98% Physical Exam  Constitutional: He is oriented to person, place, and time. He appears well-developed and well-nourished. No distress.  HENT:  Head: Normocephalic and atraumatic.  Mouth/Throat: Oropharynx is clear and moist.  Neck: Normal range of motion. Neck supple.  Cardiovascular: Normal rate and regular rhythm.  Exam reveals no friction rub.   No murmur heard. Pulmonary/Chest: Effort normal and breath sounds normal. No respiratory distress. He has no wheezes. He has no rales.  Abdominal: Soft. Bowel sounds are normal. He exhibits no distension. There is no tenderness.  Musculoskeletal: Normal range of motion. He exhibits no edema.  Neurological: He is alert and oriented to person, place, and time. Coordination normal.  Skin: Skin is warm and dry. He is not diaphoretic.  Nursing note and vitals reviewed.   ED Course  Procedures (including critical care time) Labs Review Labs Reviewed  BASIC METABOLIC PANEL - Abnormal; Notable for the  following:    Sodium 133 (*)    Chloride 98 (*)    Glucose, Bld 473 (*)    All other components within normal limits  CBG MONITORING, ED - Abnormal; Notable for the following:    Glucose-Capillary 391 (*)    All other components within normal limits  CBC  URINALYSIS, ROUTINE W REFLEX MICROSCOPIC (NOT AT [facility])  LIPASE, BLOOD    Imaging Review No results found. I have personally reviewed and evaluated these images and lab results as part  of my medical decision-making.    MDM   Final diagnoses:  None    Patient sent here from urgent care for evaluation of elevated blood sugars and microscopic hematuria. His blood sugar here was in excess of 400. He was given saline and insulin in the ER and his blood sugar is now 300. There is no evidence for ketoacidosis. He is also reporting pain in his right side. This combined with hematuria raise the concern of a kidney stone. A CT scan was performed and this was normal. At this point a urinalysis is pending.    Veryl Speak, MD 06/16/16 [phone]

## 2016-06-14 NOTE — Progress Notes (Signed)
[facility] [address], [place] [address] [phone]  Date:  06/14/2016   Name:  Calvin Huber   DOB:  [birth year]   MRN:  [id number]  PCP:  No primary care provider on file.    History of Present Illness:  Calvin Huber is a 25 y.o. male patient who presents to [facility] for annual physical exam and right sided pain.   Right sided pain: 1 month last 2-3 weeks are unbearable.  7/10 flank pain that is localized.  Colicky.  No association with eating.  No hx of kidney stones.  No fever.  No penile discharge.  He stopped taking the metformin at the end of march.    Diet: incorporating fruits, vegetables, and salads.  He eats some fried foods.  Baked meats , cutting out starches.  Hydration >64 oz per day.  Rare sodas, juice (apple, grape, green tea).  BM: Normal, no constipation or diarrhea.  No blood in the stool, or melena.  Urination: polyuria, dysuria on the last 2-3 days.  No blood in the urine.  Some urinary incontinence with urgency.  Sleep: getting up every hour to urinate.  Every 2 hours.    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  Walk and run every other weekend.   Sexually active unprotected.     Patient Active Problem List   Diagnosis Date Noted  . Obesity 09/16/2015  . Diabetes mellitus, new onset ([place]) 09/15/2015  . Dehydration 09/15/2015  . Hyponatremia 09/15/2015  . Hyperglycemia 09/14/2015    No past medical history on file.  Past Surgical History  Procedure Laterality Date  . Knee surgery      Social History  Substance Use Topics  . Smoking status: Never Smoker   . Smokeless tobacco: Never Used  . Alcohol Use: No    Family History  Problem Relation Age of Onset  . Diabetes Mother   . Diabetes Sister     No Known Allergies  Medication list has been reviewed and updated.  Current Outpatient Prescriptions on File Prior to Visit  Medication Sig Dispense  Refill  . blood glucose meter kit and supplies Dispense based on patient and insurance preference. Use up to four times daily as directed. (FOR ICD-9 250.00, 250.01). 1 each 0  . Insulin Glargine (LANTUS) 100 UNIT/ML Solostar Pen Inject 45 Units into the skin daily with breakfast. 15 mL 2  . Insulin Pen Needle 31G X 5 MM MISC 1 each by Does not apply route 2 (two) times daily before a meal. 50 each 1  . omeprazole (PRILOSEC) 40 MG capsule Take 1 capsule (40 mg total) by mouth daily. 30 capsule 0  . metFORMIN (GLUCOPHAGE) 500 MG tablet Take 1 tablet (500 mg total) by mouth 2 (two) times daily with a meal. (Patient not taking: Reported on 06/14/2016) 60 tablet 1   No current facility-administered medications on file prior to visit.    Review of Systems  Constitutional: Negative for fever and chills.  HENT: Negative for ear discharge, ear pain and sore throat.   Eyes: Positive for blurred vision. Negative for double vision.  Respiratory: Negative for cough, shortness of breath and wheezing.   Cardiovascular: Negative for chest pain, palpitations and leg swelling.  Gastrointestinal: Positive for nausea (mild intermittent) and abdominal pain (right flank). Negative for vomiting and diarrhea.  Genitourinary: Positive for frequency.  Skin: Negative for itching and rash.  Neurological: Positive for dizziness (intermittent). Negative for headaches.     Physical Examination: BP 128/80 mmHg  Pulse 95  Temp(Src) 98.8 F (37.1 C) (Oral)  Resp 185  Ht 6' 1.5" (1.867 m)  Wt 331 lb (150.141 kg)  BMI 43.07 kg/m2  SpO2 97% Ideal Body Weight: Weight in (lb) to have BMI = 25: 191.7  Physical Exam  Constitutional: He is oriented to person, place, and time. He appears well-developed and well-nourished. No distress.  HENT:  Head: Normocephalic and atraumatic.  Right Ear: Tympanic membrane, external ear and ear canal normal.  Left Ear: Tympanic membrane, external ear and ear canal normal.  Eyes:  Conjunctivae and EOM are normal. Pupils are equal, round, and reactive to light.  Cardiovascular: Normal rate and regular rhythm.  Exam reveals no friction rub.   No murmur heard. Pulmonary/Chest: Effort normal. No respiratory distress. He has no wheezes.  Abdominal: Soft. Bowel sounds are normal. He exhibits no distension and no mass. There is no hepatosplenomegaly. There is no tenderness. There is CVA tenderness (right sided). There is no tenderness at McBurney's point and negative Murphy's sign.  Musculoskeletal: Normal range of motion. He exhibits no edema or tenderness.  Neurological: He is alert and oriented to person, place, and time. He displays normal reflexes.  Skin: Skin is warm and dry. He is not diaphoretic.  Psychiatric: He has a normal mood and affect. His behavior is normal.     Assessment and Plan: [name] is a 25 y.o. male who is here today for right sided abdominal pain, form completion, and physical exam.   At this time, his right sided flank pain concerning for (kidney stone, pyelo, IBD, pain secondary to hyperglycemia) He really needs to bring this blood sugar down at this time, with insulin, and CT is suggested at this time.  I am advising him to go to the ED.  Advised risk if he fails to go.  He is able to self-transport.  Alerted Camera operator.  Type 2 diabetes mellitus with hyperglycemia, with long-term current use of insulin (HCC) - Plan: POCT glucose (manual entry), POCT urinalysis dipstick, POCT Microscopic Urinalysis ([facility]), POCT glycosylated hemoglobin (Hb A1C), COMPLETE METABOLIC PANEL WITH GFR  Screen for STD (sexually transmitted disease) - Plan: RPR, HIV antibody, GC/Chlamydia Probe Amp  Screening for thyroid disorder - Plan: TSH  Abdominal pain, chronic, right lower quadrant - Plan: POCT CBC, POCT glucose (manual entry), POCT urinalysis dipstick, POCT Microscopic Urinalysis ([facility]), POCT glycosylated hemoglobin (Hb A1C), COMPLETE METABOLIC PANEL WITH  GFR  Screening for deficiency anemia - Plan: POCT CBC  Annual physical exam - Plan: POCT CBC, POCT glucose (manual entry), POCT urinalysis dipstick, POCT Microscopic Urinalysis ([facility]), POCT glycosylated hemoglobin (Hb A1C), COMPLETE METABOLIC PANEL WITH GFR, TSH, RPR, HIV antibody, GC/Chlamydia Probe Amp  Poorly controlled diabetes mellitus ([place])  Ivar Drape, PA-C [facility] 6/24/20176:55 AM

## 2016-06-14 NOTE — Discharge Instructions (Signed)
Continue your diabetic medications as before.  Keep a record of your blood sugars and take these to your next doctor's appointment.  Return to the emergency department if you develop any new and concerning symptoms.   Hyperglycemia Hyperglycemia occurs when the glucose (sugar) in your blood is too high. Hyperglycemia can happen for many reasons, but it most often happens to people who do not know they have diabetes or are not managing their diabetes properly.  CAUSES  Whether you have diabetes or not, there are other causes of hyperglycemia. Hyperglycemia can occur when you have diabetes, but it can also occur in other situations that you might not be as aware of, such as: Diabetes  If you have diabetes and are having problems controlling your blood glucose, hyperglycemia could occur because of some of the following reasons:  Not following your meal plan.  Not taking your diabetes medications or not taking it properly.  Exercising less or doing less activity than you normally do.  Being sick. Pre-diabetes  This cannot be ignored. Before people develop Type 2 diabetes, they almost always have "pre-diabetes." This is when your blood glucose levels are higher than normal, but not yet high enough to be diagnosed as diabetes. Research has shown that some long-term damage to the body, especially the heart and circulatory system, may already be occurring during pre-diabetes. If you take action to manage your blood glucose when you have pre-diabetes, you may delay or prevent Type 2 diabetes from developing. Stress  If you have diabetes, you may be "diet" controlled or on oral medications or insulin to control your diabetes. However, you may find that your blood glucose is higher than usual in the hospital whether you have diabetes or not. This is often referred to as "stress hyperglycemia." Stress can elevate your blood glucose. This happens because of hormones put out by the body during times of  stress. If stress has been the cause of your high blood glucose, it can be followed regularly by your caregiver. That way he/she can make sure your hyperglycemia does not continue to get worse or progress to diabetes. Steroids  Steroids are medications that act on the infection fighting system (immune system) to block inflammation or infection. One side effect can be a rise in blood glucose. Most people can produce enough extra insulin to allow for this rise, but for those who cannot, steroids make blood glucose levels go even higher. It is not unusual for steroid treatments to "uncover" diabetes that is developing. It is not always possible to determine if the hyperglycemia will go away after the steroids are stopped. A special blood test called an A1c is sometimes done to determine if your blood glucose was elevated before the steroids were started. SYMPTOMS  Thirsty.  Frequent urination.  Dry mouth.  Blurred vision.  Tired or fatigue.  Weakness.  Sleepy.  Tingling in feet or leg. DIAGNOSIS  Diagnosis is made by monitoring blood glucose in one or all of the following ways:  A1c test. This is a chemical found in your blood.  Fingerstick blood glucose monitoring.  Laboratory results. TREATMENT  First, knowing the cause of the hyperglycemia is important before the hyperglycemia can be treated. Treatment may include, but is not be limited to:  Education.  Change or adjustment in medications.  Change or adjustment in meal plan.  Treatment for an illness, infection, etc.  More frequent blood glucose monitoring.  Change in exercise plan.  Decreasing or stopping steroids.  Lifestyle  changes. HOME CARE INSTRUCTIONS   Test your blood glucose as directed.  Exercise regularly. Your caregiver will give you instructions about exercise. Pre-diabetes or diabetes which comes on with stress is helped by exercising.  Eat wholesome, balanced meals. Eat often and at regular, fixed  times. Your caregiver or nutritionist will give you a meal plan to guide your sugar intake.  Being at an ideal weight is important. If needed, losing as little as 10 to 15 pounds may help improve blood glucose levels. SEEK MEDICAL CARE IF:   You have questions about medicine, activity, or diet.  You continue to have symptoms (problems such as increased thirst, urination, or weight gain). SEEK IMMEDIATE MEDICAL CARE IF:   You are vomiting or have diarrhea.  Your breath smells fruity.  You are breathing faster or slower.  You are very sleepy or incoherent.  You have numbness, tingling, or pain in your feet or hands.  You have chest pain.  Your symptoms get worse even though you have been following your caregiver's orders.  If you have any other questions or concerns.   This information is not intended to replace advice given to you by your health care provider. Make sure you discuss any questions you have with your health care provider.   Document Released: 06/04/2001 Document Revised: 03/02/2012 Document Reviewed: 08/15/2015 Elsevier Interactive Patient Education 2016 Elsevier Inc.  Flank Pain Flank pain refers to pain that is located on the side of the body between the upper abdomen and the back. The pain may occur over a short period of time (acute) or may be long-term or reoccurring (chronic). It may be mild or severe. Flank pain can be caused by many things. CAUSES  Some of the more common causes of flank pain include:  Muscle strains.   Muscle spasms.   A disease of your spine (vertebral disk disease).   A lung infection (pneumonia).   Fluid around your lungs (pulmonary edema).   A kidney infection.   Kidney stones.   A very painful skin rash caused by the chickenpox virus (shingles).   Gallbladder disease.  HOME CARE INSTRUCTIONS  Home care will depend on the cause of your pain. In general,  Rest as directed by your caregiver.  Drink enough  fluids to keep your urine clear or pale yellow.  Only take over-the-counter or prescription medicines as directed by your caregiver. Some medicines may help relieve the pain.  Tell your caregiver about any changes in your pain.  Follow up with your caregiver as directed. SEEK IMMEDIATE MEDICAL CARE IF:   Your pain is not controlled with medicine.   You have new or worsening symptoms.  Your pain increases.   You have abdominal pain.   You have shortness of breath.   You have persistent nausea or vomiting.   You have swelling in your abdomen.   You feel faint or pass out.   You have blood in your urine.  You have a fever or persistent symptoms for more than 2-3 days.  You have a fever and your symptoms suddenly get worse. MAKE SURE YOU:   Understand these instructions.  Will watch your condition.  Will get help right away if you are not doing well or get worse.   This information is not intended to replace advice given to you by your health care provider. Make sure you discuss any questions you have with your health care provider.   Document Released: 01/30/2006 Document Revised: 09/02/2012 Document Reviewed:  07/23/2012 Elsevier Interactive Patient Education [employer]

## 2016-06-14 NOTE — Patient Instructions (Addendum)
     IF you received an x-ray today, you will receive an invoice from [facility]. Please contact [facility] at [phone] with questions or concerns regarding your invoice.   IF you received labwork today, you will receive an invoice from [facility]. Please contact [facility] at [phone] with questions or concerns regarding your invoice.   Our billing staff will not be able to assist you with questions regarding bills from these companies.  You will be contacted with the lab results as soon as they are available. The fastest way to get your results is to activate your My Chart account. Instructions are located on the last page of this paperwork. If you have not heard from us regarding the results in 2 weeks, please contact this office.    Please head straight to the Emergency Department at this time to [facility], [address] [place], [place] [id number] We need to get an understanding of this abdominal pain, and get your blood sugar under control.

## 2016-06-14 NOTE — ED Notes (Signed)
Pt sent by PCP for elevated BS, pt takes 45 units Lantus daily, pt at PCP office for physical & BS read too high to read, pt denies n/v/d, pt reports R side pain, pt states, "I don't know if it is my kidneys or my stomach." pt A&O x 4

## 2016-06-14 NOTE — ED Notes (Signed)
CBG: [id number] 

## 2016-06-15 ENCOUNTER — Telehealth: Payer: Self-pay | Admitting: Physician Assistant

## 2016-06-15 LAB — TSH: TSH: 1.83 mIU/L (ref 0.40–4.50)

## 2016-06-15 LAB — GC/CHLAMYDIA PROBE AMP
CT PROBE, AMP APTIMA: NOT DETECTED
GC PROBE AMP APTIMA: NOT DETECTED

## 2016-06-15 LAB — COMPLETE METABOLIC PANEL WITHOUT GFR
ALT: 14 U/L (ref 9–46)
AST: 10 U/L (ref 10–40)
Albumin: 4 g/dL (ref 3.6–5.1)
Alkaline Phosphatase: 73 U/L (ref 40–115)
BUN: 16 mg/dL (ref 7–25)
CO2: 21 mmol/L (ref 20–31)
Calcium: 9.1 mg/dL (ref 8.6–10.3)
Chloride: 97 mmol/L — ABNORMAL LOW (ref 98–110)
Creat: 1.08 mg/dL (ref 0.60–1.35)
GFR, Est African American: >89 mL/min
GFR, Est Non African American: >89 mL/min
Glucose, Bld: 581 mg/dL (ref 65–99)
Potassium: 4.5 mmol/L (ref 3.5–5.3)
Sodium: 131 mmol/L — ABNORMAL LOW (ref 135–146)
Total Bilirubin: 0.6 mg/dL (ref 0.2–1.2)
Total Protein: 7.5 g/dL (ref 6.1–8.1)

## 2016-06-15 LAB — RPR

## 2016-06-15 LAB — HIV ANTIBODY (ROUTINE TESTING W REFLEX): HIV: NONREACTIVE

## 2016-06-15 NOTE — Telephone Encounter (Signed)
I have placed his physical form in the drawer in envelope for pick up.  Alert patient.

## 2017-05-30 ENCOUNTER — Ambulatory Visit (INDEPENDENT_AMBULATORY_CARE_PROVIDER_SITE_OTHER): Payer: Commercial Managed Care - PPO | Admitting: Physician Assistant

## 2017-05-30 ENCOUNTER — Encounter: Payer: Self-pay | Admitting: Physician Assistant

## 2017-05-30 VITALS — BP 114/81 | HR 82 | Temp 98.7°F | Resp 16 | Ht 74.0 in | Wt 304.0 lb

## 2017-05-30 DIAGNOSIS — Z23 Encounter for immunization: Secondary | ICD-10-CM

## 2017-05-30 DIAGNOSIS — Z113 Encounter for screening for infections with a predominantly sexual mode of transmission: Secondary | ICD-10-CM | POA: Diagnosis not present

## 2017-05-30 DIAGNOSIS — H543 Unqualified visual loss, both eyes: Secondary | ICD-10-CM

## 2017-05-30 DIAGNOSIS — E1165 Type 2 diabetes mellitus with hyperglycemia: Secondary | ICD-10-CM

## 2017-05-30 DIAGNOSIS — Z0000 Encounter for general adult medical examination without abnormal findings: Secondary | ICD-10-CM | POA: Diagnosis not present

## 2017-05-30 DIAGNOSIS — IMO0001 Reserved for inherently not codable concepts without codable children: Secondary | ICD-10-CM

## 2017-05-30 DIAGNOSIS — Z114 Encounter for screening for human immunodeficiency virus [HIV]: Secondary | ICD-10-CM

## 2017-05-30 DIAGNOSIS — Z130 Encounter for screening for diseases of the blood and blood-forming organs and certain disorders involving the immune mechanism: Secondary | ICD-10-CM

## 2017-05-30 MED ORDER — METFORMIN HCL ER 500 MG PO TB24: 1000.0000 mg | ORAL_TABLET | Freq: Two times a day (BID) | ORAL | 2 refills | Status: DC

## 2017-05-30 NOTE — Patient Instructions (Signed)
Start metformin 1 pill in the pm and then in 2 days add an addition pill in the pm - then in 3-5 days add another pills in the am and then in 3 days increase to 2 pills 2x/day  I will contact you with your lab results as soon as they are available.   If you have not heard from me in 2 weeks, please contact me.  The fastest way to get your results is to register for My Chart (see the instructions on the last page of this printout).  Sign up for mychart - download the app and then use the tect to activate - I will let you know the next step through that

## 2017-05-30 NOTE — Progress Notes (Signed)
   Subjective:    Patient ID: Calvin Huber, male    DOB: 01/14/1991, 26 y.o.   MRN: [id number]  HPI    Review of Systems  Constitutional: Negative.   HENT: Positive for voice change.   Eyes: Negative.   Respiratory: Negative.   Cardiovascular: Negative.   Gastrointestinal: Negative.   Endocrine: Positive for polydipsia and polyuria.  Genitourinary: Negative.   Musculoskeletal: Negative.   Skin: Negative.   Allergic/Immunologic: Negative.   Neurological: Negative.   Hematological: Negative.   Psychiatric/Behavioral: Negative.        Objective:   Physical Exam        Assessment & Plan:

## 2017-05-30 NOTE — Progress Notes (Signed)
Calvin Huber  MRN: [id number] DOB: [birth year]  PCP: Patient, No Pcp Per  Subjective:  Pt presents to clinic for a CPE.  Doing well and only concern is his DM  Glucose at home - fasting low to mid 200s - been off medications for 8-9 months - he cut down carbs, greasy foods, portions, no more sweet tea - now green tea or water -- stopped medication due to side effects  Acid reflux - stopped with change in his diet  Last dental exam: not in 3-4 years Last vision exam: needs  Vaccinations      Tetanus  Typical meals for patient: 2 meals, snacks - protein bars or bananas Typical beverage choices: water Exercises: 2 times a week running about 20-30 mins and weights once a week for about an hour  Sleeps: sleeping well 7 hrs per night  Patient Active Problem List   Diagnosis Date Noted  . Uncontrolled type 2 diabetes mellitus without complication, without long-term current use of insulin ([place]) 05/30/2017  . Obesity 09/16/2015  . Hyperglycemia 09/14/2015    Review of Systems  Constitutional: Negative.   HENT: Negative.   Eyes: Negative.   Respiratory: Negative.   Cardiovascular: Negative.   Gastrointestinal: Negative.   Endocrine: Negative.   Genitourinary: Negative.   Musculoskeletal: Negative.   Skin: Negative.   Allergic/Immunologic: Negative.   Neurological: Negative.   Hematological: Negative.   Psychiatric/Behavioral: Negative.      Current Outpatient Prescriptions on File Prior to Visit  Medication Sig Dispense Refill  . blood glucose meter kit and supplies Dispense based on patient and insurance preference. Use up to four times daily as directed. (FOR ICD-9 250.00, 250.01). 1 each 0  . Insulin Pen Needle 31G X 5 MM MISC 1 each by Does not apply route 2 (two) times daily before a meal. 50 each 1  . Insulin Glargine (LANTUS) 100 UNIT/ML Solostar Pen Inject 45 Units into the skin daily with breakfast. (Patient not taking: Reported on 05/30/2017) 15 mL 2   No  current facility-administered medications on file prior to visit.     No Known Allergies  Social History   Social History  . Marital status: Single    Spouse name: N/A  . Number of children: N/A  . Years of education: N/A   Occupational History  . Government social research officer    Social History Main Topics  . Smoking status: Never Smoker  . Smokeless tobacco: Never Used  . Alcohol use No  . Drug use: No  . Sexual activity: Yes    Partners: Female   Other Topics Concern  . None   Social History Narrative   Probation officer   Lives with mother          Past Surgical History:  Procedure Laterality Date  . KNEE SURGERY      Family History  Problem Relation Age of Onset  . Diabetes Mother   . Diabetes Sister      Objective:  BP 114/81   Pulse 82   Temp 98.7 F (37.1 C) (Oral)   Resp 16   Ht 6' 2"  (1.88 m)   Wt (!) 304 lb (137.9 kg)   SpO2 99%   BMI 39.03 kg/m   Physical Exam  Constitutional: He is oriented to person, place, and time and well-developed, well-nourished, and in no distress.  HENT:  Head: Normocephalic and atraumatic.  Right Ear: Hearing, tympanic membrane, external ear and ear canal normal.  Left Ear:  Hearing, tympanic membrane, external ear and ear canal normal.  Nose: Nose normal.  Mouth/Throat: Uvula is midline, oropharynx is clear and moist and mucous membranes are normal.  Eyes: Conjunctivae and EOM are normal. Pupils are equal, round, and reactive to light.  Neck: Trachea normal and normal range of motion. Neck supple. No thyroid mass and no thyromegaly present.  Cardiovascular: Normal rate, regular rhythm and normal heart sounds.   No murmur heard. Pulmonary/Chest: Effort normal and breath sounds normal.  Abdominal: Soft. Bowel sounds are normal.  Genitourinary: Testes/scrotum normal and penis normal.  Musculoskeletal: Normal range of motion.  Neurological: He is alert and oriented to person, place, and time. Gait normal.  Skin:  Skin is warm and dry.  Psychiatric: Mood, memory, affect and judgment normal.    Wt Readings from Last 3 Encounters:  05/30/17 (!) 304 lb (137.9 kg)  06/14/16 (!) 323 lb 1 oz (146.5 kg)  06/14/16 (!) 331 lb (150.1 kg)     Visual Acuity Screening   Right eye Left eye Both eyes  Without correction: 20/50 20/50 20/30   With correction:       Assessment and Plan :  Annual physical exam - Plan: metFORMIN (GLUCOPHAGE XR) 500 MG 24 hr tablet, Care order/instruction:  Need for Tdap vaccination - Plan: Tdap vaccine greater than or equal to 28yo IM  Screening for HIV (human immunodeficiency virus) - Plan: HIV antibody  Screen for STD (sexually transmitted disease) - Plan: RPR, GC/Chlamydia Probe Amp, Trichomonas vaginalis, RNA  Uncontrolled type 2 diabetes mellitus without complication, without long-term current use of insulin ([place]) - Plan: HM DIABETES FOOT EXAM, Lipid panel, CMP14+EGFR, Hemoglobin A1c, Microalbumin, urine -- d/w pt that he has done a great job with weight loss and diet changes but he likely still medications - will change to metformin XR for tolerability - d/w pt either jardiance or trulicity for next step depending on his lab results - if he  Needs further medications he will RTC to discuss next step - if not he will recheck in 3 months  Screening for deficiency anemia - Plan: CBC with Differential/Platelet  Decreased vision in both eyes - encouraged vision exam - also needs DM eye exam  Windell Hummingbird PA-C  [facility] [place] 28/07/2017 6:11 PM

## 2017-05-31 LAB — MICROALBUMIN, URINE: MICROALBUM., U, RANDOM: 7.7 ug/mL

## 2017-05-31 LAB — TRICHOMONAS VAGINALIS, PROBE AMP: TRICH VAG BY NAA: NEGATIVE

## 2017-06-01 LAB — GC/CHLAMYDIA PROBE AMP
Chlamydia trachomatis, NAA: NEGATIVE
NEISSERIA GONORRHOEAE BY PCR: NEGATIVE

## 2017-06-02 LAB — RPR: RPR: NONREACTIVE

## 2017-06-02 LAB — CBC WITH DIFFERENTIAL/PLATELET
BASOS: 0 %
Basophils Absolute: 0 10*3/uL (ref 0.0–0.2)
EOS (ABSOLUTE): 0.2 10*3/uL (ref 0.0–0.4)
EOS: 2 %
HEMATOCRIT: 44.6 % (ref 37.5–51.0)
HEMOGLOBIN: 14.3 g/dL (ref 13.0–17.7)
IMMATURE GRANS (ABS): 0 10*3/uL (ref 0.0–0.1)
IMMATURE GRANULOCYTES: 0 %
LYMPHS: 32 %
Lymphocytes Absolute: 2.9 10*3/uL (ref 0.7–3.1)
MCH: 27.2 pg (ref 26.6–33.0)
MCHC: 32.1 g/dL (ref 31.5–35.7)
MCV: 85 fL (ref 79–97)
Monocytes Absolute: 0.4 10*3/uL (ref 0.1–0.9)
Monocytes: 5 %
NEUTROS PCT: 61 %
Neutrophils Absolute: 5.6 10*3/uL (ref 1.4–7.0)
Platelets: 252 10*3/uL (ref 150–379)
RBC: 5.25 x10E6/uL (ref 4.14–5.80)
RDW: 14.1 % (ref 12.3–15.4)
WBC: 9.2 10*3/uL (ref 3.4–10.8)

## 2017-06-02 LAB — CMP14+EGFR
ALT: 16 IU/L (ref 0–44)
AST: 12 IU/L (ref 0–40)
Albumin/Globulin Ratio: 1.5 (ref 1.2–2.2)
Albumin: 4.8 g/dL (ref 3.5–5.5)
Alkaline Phosphatase: 54 IU/L (ref 39–117)
BUN/Creatinine Ratio: 11 (ref 9–20)
BUN: 11 mg/dL (ref 6–20)
Bilirubin Total: 0.6 mg/dL (ref 0.0–1.2)
CALCIUM: 9.7 mg/dL (ref 8.7–10.2)
CO2: 23 mmol/L (ref 18–29)
CREATININE: 1.02 mg/dL (ref 0.76–1.27)
Chloride: 101 mmol/L (ref 96–106)
GFR calc non Af Amer: 100 mL/min/{1.73_m2} (ref >59)
GFR, EST AFRICAN AMERICAN: 116 mL/min/{1.73_m2} (ref >59)
GLOBULIN, TOTAL: 3.1 g/dL (ref 1.5–4.5)
Glucose: 288 mg/dL — ABNORMAL HIGH (ref 65–99)
Potassium: 4.1 mmol/L (ref 3.5–5.2)
Sodium: 141 mmol/L (ref 134–144)
TOTAL PROTEIN: 7.9 g/dL (ref 6.0–8.5)

## 2017-06-02 LAB — HIV ANTIBODY (ROUTINE TESTING W REFLEX): HIV SCREEN 4TH GENERATION: NONREACTIVE

## 2017-06-02 LAB — HEMOGLOBIN A1C
Est. average glucose Bld gHb Est-mCnc: >398 mg/dL
Hgb A1c MFr Bld: >15.5 % — ABNORMAL HIGH (ref 4.8–5.6)

## 2017-06-02 LAB — LIPID PANEL
CHOL/HDL RATIO: 4.1 ratio (ref 0.0–5.0)
CHOLESTEROL TOTAL: 190 mg/dL (ref 100–199)
HDL: 46 mg/dL (ref >39)
LDL CALC: 128 mg/dL — AB (ref 0–99)
Triglycerides: 82 mg/dL (ref 0–149)
VLDL Cholesterol Cal: 16 mg/dL (ref 5–40)

## 2017-06-14 DIAGNOSIS — H5213 Myopia, bilateral: Secondary | ICD-10-CM | POA: Diagnosis not present

## 2017-06-27 ENCOUNTER — Encounter (HOSPITAL_COMMUNITY): Payer: Self-pay

## 2017-06-27 ENCOUNTER — Ambulatory Visit (HOSPITAL_COMMUNITY)
Admission: EM | Admit: 2017-06-27 | Discharge: 2017-06-27 | Disposition: A | Discharge status: Home / Self Care | Payer: Commercial Managed Care - PPO | Attending: Internal Medicine | Admitting: Internal Medicine

## 2017-06-27 DIAGNOSIS — J020 Streptococcal pharyngitis: Secondary | ICD-10-CM | POA: Diagnosis not present

## 2017-06-27 LAB — POCT RAPID STREP A: STREPTOCOCCUS, GROUP A SCREEN (DIRECT): POSITIVE — AB

## 2017-06-27 MED ORDER — PENICILLIN V POTASSIUM 500 MG PO TABS: 500.0000 mg | ORAL_TABLET | Freq: Three times a day (TID) | ORAL | 0 refills | Status: DC

## 2017-06-27 MED ORDER — PHENOL 1.4 % MT LIQD: 1.0000 | OROMUCOSAL | 0 refills | Status: DC | PRN

## 2017-06-27 NOTE — ED Provider Notes (Signed)
CSN: [id number]     Arrival date & time 06/27/17  1225 History   None    Chief Complaint  Patient presents with  . Sore Throat   (Consider location/radiation/quality/duration/timing/severity/associated sxs/prior Treatment) 26 year old male comes in with 1 day history of sore throat. States he woke up this morning and is having trouble swallowing due to pain, he took Aleve with no relief. Also has a headache mostly at the temporal area, also without relief on Aleve. He has some sensitivity to light. Denies phonophobia, nausea, vomiting. Denies fever, chills, night sweats. Denies trouble swallowing, trouble breathing, throat swelling.       Past Medical History:  Diagnosis Date  . Allergy   . Diabetes mellitus without complication [facility])    Past Surgical History:  Procedure Laterality Date  . KNEE SURGERY     Family History  Problem Relation Age of Onset  . Diabetes Mother   . Diabetes Sister    Social History  Substance Use Topics  . Smoking status: Never Smoker  . Smokeless tobacco: Never Used  . Alcohol use No    Review of Systems  Constitutional: Negative for chills, diaphoresis and fever.  HENT: Positive for congestion, sinus pain, sinus pressure and sore throat. Negative for ear discharge, ear pain, postnasal drip, rhinorrhea and trouble swallowing.   Eyes: Negative for pain, discharge, redness and itching.  Respiratory: Negative for cough, shortness of breath and wheezing.   Cardiovascular: Negative for chest pain and palpitations.  Neurological: Positive for headaches. Negative for dizziness, tremors, syncope, weakness, light-headedness and numbness.    Allergies  Patient has no known allergies.  Home Medications   Prior to Admission medications   Medication Sig Start Date End Date Taking? Authorizing Provider  blood glucose meter kit and supplies Dispense based on patient and insurance preference. Use up to four times daily as directed. (FOR ICD-9 250.00,  250.01). 09/16/15  Yes Domenic Polite, MD  metFORMIN (GLUCOPHAGE XR) 500 MG 24 hr tablet Take 2 tablets (1,000 mg total) by mouth 2 (two) times daily. 05/30/17  Yes Weber, Damaris Hippo, PA-C  Insulin Glargine (LANTUS) 100 UNIT/ML Solostar Pen Inject 45 Units into the skin daily with breakfast. Patient not taking: Reported on 05/30/2017 09/16/15   Domenic Polite, MD  Insulin Pen Needle 31G X 5 MM MISC 1 each by Does not apply route 2 (two) times daily before a meal. 09/16/15   Domenic Polite, MD  penicillin v potassium (VEETID) 500 MG tablet Take 1 tablet (500 mg total) by mouth 3 (three) times daily. 06/27/17   Tasia Catchings, Alex Mcmanigal V, PA-C  phenol (CHLORASEPTIC) 1.4 % LIQD Use as directed 1 spray in the mouth or throat as needed for throat irritation / pain. 06/27/17   Ok Edwards, PA-C   Meds Ordered and Administered this Visit  Medications - No data to display  BP 107/64   Pulse 97   Temp 100 F (37.8 C) (Oral)   Resp 20   SpO2 100%  No data found.   Physical Exam  Constitutional: He is oriented to person, place, and time. He appears well-developed and well-nourished. No distress.  HENT:  Head: Normocephalic and atraumatic.  Right Ear: Tympanic membrane, external ear and ear canal normal. Tympanic membrane is not erythematous and not bulging.  Left Ear: Tympanic membrane, external ear and ear canal normal. Tympanic membrane is not erythematous and not bulging.  Nose: Rhinorrhea present. No mucosal edema. Right sinus exhibits maxillary sinus tenderness and frontal sinus tenderness. Left  sinus exhibits maxillary sinus tenderness and frontal sinus tenderness.  Mouth/Throat: Uvula is midline and mucous membranes are normal. Posterior oropharyngeal erythema present. No posterior oropharyngeal edema. Tonsils are 2+ on the right. Tonsils are 2+ on the left. Tonsillar exudate.  Cardiovascular: Normal rate, regular rhythm and normal heart sounds.  Exam reveals no gallop and no friction rub.   No murmur  heard. Pulmonary/Chest: Effort normal and breath sounds normal. He has no wheezes. He has no rales.  Neurological: He is alert and oriented to person, place, and time.  Skin: Skin is warm and dry.  Psychiatric: He has a normal mood and affect. His behavior is normal. Judgment normal.    Urgent Care Course     Procedures (including critical care time)  Labs Review Labs Reviewed  POCT RAPID STREP A - Abnormal; Notable for the following:       Result Value   Streptococcus, Group A Screen (Direct) POSITIVE (*)    All other components within normal limits    Imaging Review No results found.      MDM   1. Strep pharyngitis    Discussed lab results with patient, rapid strep positive. Start penicillin 500 mg 3 times a day for 10 days. Given patient's sore throat, start phenol throat spray as needed.Side effects of medicines discussed with patient. If notice any allergies to medicine, to stop medicines and contact PCP. Patient to push fluids. Can use Flonase, decongestants for sinus pressure/pain. Take Tylenol/Motrin for headache. Patient to monitor for worsening of symptoms, trouble swallowing, trouble breathing, swelling of throat, to the ED.    Ok Edwards, PA-C 06/27/17 [phone]

## 2017-06-27 NOTE — Discharge Instructions (Addendum)
Your rapid strep was positive. Start penicillin as directed. Discard toothbrush after 24 hours of antibiotics. You can return to work after 24 hours being on antibiotics, or 24 hours fever free. Throat spray for throat pain as needed. Keep hydrated. Monitor for trouble swallowing, trouble breathing, swelling in the throat, to go to the ED immediately.

## 2017-06-27 NOTE — ED Triage Notes (Signed)
Pt woke up with a sore throat and headache today. Low grade temp. Took aleve at 8am. It did not help. No other symptoms.

## 2017-10-06 DIAGNOSIS — S60041A Contusion of right ring finger without damage to nail, initial encounter: Secondary | ICD-10-CM | POA: Diagnosis not present

## 2017-10-07 DIAGNOSIS — L03019 Cellulitis of unspecified finger: Secondary | ICD-10-CM | POA: Diagnosis not present

## 2017-10-07 DIAGNOSIS — S60041D Contusion of right ring finger without damage to nail, subsequent encounter: Secondary | ICD-10-CM | POA: Diagnosis not present

## 2017-10-27 DIAGNOSIS — H25042 Posterior subcapsular polar age-related cataract, left eye: Secondary | ICD-10-CM | POA: Diagnosis not present

## 2017-10-27 DIAGNOSIS — H25011 Cortical age-related cataract, right eye: Secondary | ICD-10-CM | POA: Diagnosis not present

## 2018-04-11 ENCOUNTER — Encounter: Payer: Self-pay | Admitting: Physician Assistant

## 2018-04-11 ENCOUNTER — Other Ambulatory Visit: Payer: Self-pay

## 2018-04-11 ENCOUNTER — Ambulatory Visit: Payer: Commercial Managed Care - PPO | Admitting: Physician Assistant

## 2018-04-11 VITALS — BP 110/82 | HR 80 | Temp 97.7°F | Resp 16 | Ht 74.0 in | Wt 303.8 lb

## 2018-04-11 DIAGNOSIS — IMO0001 Reserved for inherently not codable concepts without codable children: Secondary | ICD-10-CM

## 2018-04-11 DIAGNOSIS — E1165 Type 2 diabetes mellitus with hyperglycemia: Secondary | ICD-10-CM | POA: Diagnosis not present

## 2018-04-11 DIAGNOSIS — R21 Rash and other nonspecific skin eruption: Secondary | ICD-10-CM

## 2018-04-11 DIAGNOSIS — L738 Other specified follicular disorders: Secondary | ICD-10-CM

## 2018-04-11 DIAGNOSIS — N529 Male erectile dysfunction, unspecified: Secondary | ICD-10-CM

## 2018-04-11 DIAGNOSIS — E7800 Pure hypercholesterolemia, unspecified: Secondary | ICD-10-CM | POA: Diagnosis not present

## 2018-04-11 LAB — POCT SKIN KOH: SKIN KOH, POC: NEGATIVE

## 2018-04-11 LAB — POCT GLYCOSYLATED HEMOGLOBIN (HGB A1C): Hemoglobin A1C: >14

## 2018-04-11 MED ORDER — METFORMIN HCL ER 500 MG PO TB24: 1000.0000 mg | ORAL_TABLET | Freq: Two times a day (BID) | ORAL | 0 refills | Status: DC

## 2018-04-11 MED ORDER — TRIAMCINOLONE ACETONIDE 0.5 % EX CREA: 1.0000 "application " | TOPICAL_CREAM | Freq: Two times a day (BID) | CUTANEOUS | 0 refills | Status: DC

## 2018-04-11 MED ORDER — EMPAGLIFLOZIN 25 MG PO TABS: 25.0000 mg | ORAL_TABLET | Freq: Every day | ORAL | 2 refills | Status: DC

## 2018-04-11 MED ORDER — DULAGLUTIDE 0.75 MG/0.5ML ~~LOC~~ SOAJ: 0.7500 mg | SUBCUTANEOUS | 0 refills | Status: DC

## 2018-04-11 MED ORDER — DOXYCYCLINE HYCLATE 100 MG PO TABS
100.0000 mg | ORAL_TABLET | Freq: Two times a day (BID) | ORAL | 0 refills | Status: DC
Start: 2018-04-11 — End: 2018-05-29

## 2018-04-11 MED ORDER — SILDENAFIL CITRATE 100 MG PO TABS: 50.0000 mg | ORAL_TABLET | Freq: Every day | ORAL | 0 refills | Status: DC | PRN

## 2018-04-11 NOTE — Progress Notes (Signed)
Calvin Huber  MRN: [id number] DOB: [birth year]  PCP: Calvin Bale, PA-C  Chief Complaint  Patient presents with  . Diabetes    Subjective:  Pt presents to clinic for DM check.  He has not been seen in close to a year - he has been rationing his medications and not using insulin.  He has continue his diet changes and he feels much better than he did this time last year.  Glucophage - 1000 daily No insulin - longer than 3 months  Last saw ophthalmologist - they were unsure if he needed refraction change or if he had early cataracts - they thought likely related to DM  Rash on the back of his neck - he has had for the last 3-4 months - starting to get more - they will sometimes itch - barber had not noticed before this time frame but he has since noticed that it is getting worse - spreading and lesions that he has are not getting better.  They do not itch or hurt.  Glucose at home - 270s better than the 400-500s last year.  History is obtained by patient.  Review of Systems  Constitutional: Negative for chills and fever.  Cardiovascular: Negative for chest pain and leg swelling.  Genitourinary:       Trouble with getting and maintaining erections - been going on a while and now starting to cause trouble  Neurological: Negative for numbness.       No feet paresthesias.    Patient Active Problem List   Diagnosis Date Noted  . Uncontrolled type 2 diabetes mellitus without complication, without long-term current use of insulin (Calvin Huber) 05/30/2017  . Obesity 09/16/2015  . Hyperglycemia 09/14/2015    Current Outpatient Medications on File Prior to Visit  Medication Sig Dispense Refill  . Insulin Pen Needle 31G X 5 MM MISC 1 each by Does not apply route 2 (two) times daily before a meal. 50 each 1  . blood glucose meter kit and supplies Dispense based on patient and insurance preference. Use up to four times daily as directed. (FOR ICD-9 250.00, 250.01). (Patient not taking:  Reported on 04/11/2018) 1 each 0  . Insulin Glargine (LANTUS) 100 UNIT/ML Solostar Pen Inject 45 Units into the skin daily with breakfast. (Patient not taking: Reported on 05/30/2017) 15 mL 2   No current facility-administered medications on file prior to visit.     No Known Allergies  Past Medical History:  Diagnosis Date  . Allergy   . 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 Smoking status: Never Smoker  . Smokeless tobacco: Never Used  Substance Use Topics  . Alcohol use: No  . Drug use: No   family history includes Diabetes in his mother and sister.     Objective:  BP 110/82   Pulse 80   Temp 97.7 F (36.5 C) (Oral)   Resp 16   Ht 6' 2"  (1.88 m)   Wt (!) 303 lb 12.8 oz (137.8 kg)   SpO2 99%   BMI 39.01 kg/m  Body mass index is 39.01 kg/m.  Physical Exam  Constitutional: He is oriented to person, place, and time.  HENT:  Head: Normocephalic and atraumatic.  Right Ear: External ear normal.  Left Ear: External ear normal.  Eyes: Conjunctivae are normal.  Neck: Normal range of motion.  Cardiovascular: Normal rate, regular rhythm and normal heart sounds.  Pulmonary/Chest: Effort normal and breath sounds normal.  Neurological: He is alert and oriented to person, place, and time.  Skin: Skin is warm and dry. Rash noted. Rash is nodular (flesh colored nodules on back of scalp - some excoritated.  no pustules). Rash is not pustular.  Psychiatric: Judgment normal.    Results for orders placed or performed in visit on 04/11/18  CMP14+EGFR  Result Value Ref Range   Glucose 268 (H) 65 - 99 mg/dL   BUN 12 6 - 20 mg/dL   Creatinine, Ser 1.07 0.76 - 1.27 mg/dL   GFR calc non Af Amer 94 >59 mL/min/1.73   GFR calc Af Amer 109 >59 mL/min/1.73   BUN/Creatinine Ratio 11 9 - 20   Sodium 141 134 - 144 mmol/L   Potassium 4.2 3.5 -  5.2 mmol/L   Chloride 99 96 - 106 mmol/L   CO2 24 20 - 29 mmol/L   Calcium 10.0 8.7 - 10.2 mg/dL   Total Protein 7.8 6.0 - 8.5 g/dL   Albumin 4.8 3.5 - 5.5 g/dL   Globulin, Total 3.0 1.5 - 4.5 g/dL   Albumin/Globulin Ratio 1.6 1.2 - 2.2   Bilirubin Total 0.9 0.0 - 1.2 mg/dL   Alkaline Phosphatase 57 39 - 117 IU/L   AST 14 0 - 40 IU/L   ALT 18 0 - 44 IU/L  Lipid panel  Result Value Ref Range   Cholesterol, Total 198 100 - 199 mg/dL   Triglycerides 107 0 - 149 mg/dL   HDL 40 >39 mg/dL   VLDL Cholesterol Cal 21 5 - 40 mg/dL   LDL Calculated 137 (H) 0 - 99 mg/dL   Chol/HDL Ratio 5.0 0.0 - 5.0 ratio  POCT glycosylated hemoglobin (Hb A1C)  Result Value Ref Range   Hemoglobin A1C >14.0   POCT Skin KOH  Result Value Ref Range   Skin KOH, POC Negative Negative    Assessment and Plan :  Uncontrolled type 2 diabetes mellitus without complication, without long-term current use of insulin (HCC) - Plan: POCT glycosylated hemoglobin (Hb A1C), CMP14+EGFR, empagliflozin (JARDIANCE) 25 MG TABS tablet, metFORMIN (GLUCOPHAGE XR) 500 MG 24 hr tablet, Dulaglutide (TRULICITY) 7.06 CB/7.6EG SOPN - uncontrolled and noncompliant - pt is ready to get started with his care.  We will increase his metformin to what he should have been taking.  We will start 2 additional medications as he is not having current symptoms of high glucose and recheck him in 6 weeks.  D/w pt how to use new medications and what to expect - we will add medications for cholesterol and kidney protection at next visit.  Elevated LDL cholesterol level - Plan: Lipid panel - check labs  Rash and nonspecific skin eruption - Plan: POCT Skin KOH  Folliculitis barbae - Plan: doxycycline (VIBRA-TABS) 100 MG tablet, triamcinolone cream (KENALOG) 0.5 % - no fungus on scrapping - treat for infection and then inflammation - try to not irritate the area to make it worse.  Use cream to help it resolve.  We will f/u with this in 6 weeks at his  recheck.  Erectile dysfunction, unspecified erectile dysfunction type - Plan: sildenafil (VIAGRA) 100 MG tablet - d/w pt that this this is likely from uncontrolled DM and it may get better but it is also possible that it will not.  Windell Hummingbird PA-C  [facility]  04/16/2018 9:46 AM

## 2018-04-11 NOTE — Patient Instructions (Signed)
     IF you received an x-ray today, you will receive an invoice from [facility]. Please contact  Radiology at [phone] with questions or concerns regarding your invoice.   IF you received labwork today, you will receive an invoice from [facility]. Please contact [facility] at [phone] with questions or concerns regarding your invoice.   Our billing staff will not be able to assist you with questions regarding bills from these companies.  You will be contacted with the lab results as soon as they are available. The fastest way to get your results is to activate your My Chart account. Instructions are located on the last page of this paperwork. If you have not heard from us regarding the results in 2 weeks, please contact this office.     

## 2018-04-11 NOTE — Progress Notes (Signed)
102

## 2018-04-12 LAB — LIPID PANEL
CHOLESTEROL TOTAL: 198 mg/dL (ref 100–199)
Chol/HDL Ratio: 5 ratio (ref 0.0–5.0)
HDL: 40 mg/dL (ref >39)
LDL CALC: 137 mg/dL — AB (ref 0–99)
TRIGLYCERIDES: 107 mg/dL (ref 0–149)
VLDL CHOLESTEROL CAL: 21 mg/dL (ref 5–40)

## 2018-04-12 LAB — CMP14+EGFR
ALBUMIN: 4.8 g/dL (ref 3.5–5.5)
ALK PHOS: 57 IU/L (ref 39–117)
ALT: 18 IU/L (ref 0–44)
AST: 14 IU/L (ref 0–40)
Albumin/Globulin Ratio: 1.6 (ref 1.2–2.2)
BILIRUBIN TOTAL: 0.9 mg/dL (ref 0.0–1.2)
BUN/Creatinine Ratio: 11 (ref 9–20)
BUN: 12 mg/dL (ref 6–20)
CHLORIDE: 99 mmol/L (ref 96–106)
CO2: 24 mmol/L (ref 20–29)
CREATININE: 1.07 mg/dL (ref 0.76–1.27)
Calcium: 10 mg/dL (ref 8.7–10.2)
GFR calc Af Amer: 109 mL/min/{1.73_m2} (ref >59)
GFR calc non Af Amer: 94 mL/min/{1.73_m2} (ref >59)
Globulin, Total: 3 g/dL (ref 1.5–4.5)
Glucose: 268 mg/dL — ABNORMAL HIGH (ref 65–99)
Potassium: 4.2 mmol/L (ref 3.5–5.2)
Sodium: 141 mmol/L (ref 134–144)
Total Protein: 7.8 g/dL (ref 6.0–8.5)

## 2018-04-18 MED ORDER — ROSUVASTATIN CALCIUM 10 MG PO TABS
10.0000 mg | ORAL_TABLET | Freq: Every day | ORAL | 3 refills | Status: DC
Start: 2018-04-18 — End: 2018-12-29

## 2018-04-18 NOTE — Addendum Note (Signed)
Addended by: Morrell Riddle on: 04/18/2018 11:36 AM   Modules accepted: Orders

## 2018-05-21 ENCOUNTER — Telehealth: Payer: Self-pay | Admitting: Physician Assistant

## 2018-05-21 NOTE — Telephone Encounter (Signed)
Copied from CRM [id number]. Topic: Quick Communication - See Telephone Encounter >> May 21, 2018  5:44 PM Trula Slade wrote: CRM for notification. See Telephone encounter for: 05/21/18. Patient is requesting antibiotics for the infection on the back of his neck.

## 2018-05-22 NOTE — Telephone Encounter (Signed)
Patient states he is going to see sarah on June 7th.

## 2018-05-29 ENCOUNTER — Other Ambulatory Visit: Payer: Self-pay

## 2018-05-29 ENCOUNTER — Ambulatory Visit (INDEPENDENT_AMBULATORY_CARE_PROVIDER_SITE_OTHER): Payer: Commercial Managed Care - PPO | Admitting: Physician Assistant

## 2018-05-29 ENCOUNTER — Encounter: Payer: Self-pay | Admitting: Physician Assistant

## 2018-05-29 VITALS — BP 106/64 | HR 96 | Temp 98.7°F | Resp 18 | Ht 73.62 in | Wt 307.4 lb

## 2018-05-29 DIAGNOSIS — Z0000 Encounter for general adult medical examination without abnormal findings: Secondary | ICD-10-CM | POA: Diagnosis not present

## 2018-05-29 DIAGNOSIS — N529 Male erectile dysfunction, unspecified: Secondary | ICD-10-CM

## 2018-05-29 DIAGNOSIS — Z1329 Encounter for screening for other suspected endocrine disorder: Secondary | ICD-10-CM

## 2018-05-29 DIAGNOSIS — Z130 Encounter for screening for diseases of the blood and blood-forming organs and certain disorders involving the immune mechanism: Secondary | ICD-10-CM | POA: Diagnosis not present

## 2018-05-29 DIAGNOSIS — R21 Rash and other nonspecific skin eruption: Secondary | ICD-10-CM

## 2018-05-29 DIAGNOSIS — E7800 Pure hypercholesterolemia, unspecified: Secondary | ICD-10-CM | POA: Diagnosis not present

## 2018-05-29 DIAGNOSIS — H547 Unspecified visual loss: Secondary | ICD-10-CM | POA: Diagnosis not present

## 2018-05-29 DIAGNOSIS — L738 Other specified follicular disorders: Secondary | ICD-10-CM

## 2018-05-29 DIAGNOSIS — IMO0001 Reserved for inherently not codable concepts without codable children: Secondary | ICD-10-CM

## 2018-05-29 DIAGNOSIS — E1165 Type 2 diabetes mellitus with hyperglycemia: Secondary | ICD-10-CM

## 2018-05-29 MED ORDER — TRIAMCINOLONE ACETONIDE 0.5 % EX CREA: 1.0000 "application " | TOPICAL_CREAM | Freq: Two times a day (BID) | CUTANEOUS | 0 refills | Status: DC

## 2018-05-29 MED ORDER — GLUCOSE BLOOD VI STRP: ORAL_STRIP | 12 refills | Status: AC

## 2018-05-29 MED ORDER — SILDENAFIL CITRATE 100 MG PO TABS: 100.0000 mg | ORAL_TABLET | Freq: Every day | ORAL | 2 refills | Status: DC | PRN

## 2018-05-29 MED ORDER — DOXYCYCLINE HYCLATE 100 MG PO TABS: 100.0000 mg | ORAL_TABLET | Freq: Two times a day (BID) | ORAL | 0 refills | Status: DC

## 2018-05-29 MED ORDER — EMPAGLIFLOZIN 25 MG PO TABS: 25.0000 mg | ORAL_TABLET | Freq: Every day | ORAL | 2 refills | Status: DC

## 2018-05-29 MED ORDER — METFORMIN HCL ER 500 MG PO TB24: 1000.0000 mg | ORAL_TABLET | Freq: Two times a day (BID) | ORAL | 0 refills | Status: DC

## 2018-05-29 MED ORDER — DULAGLUTIDE 0.75 MG/0.5ML ~~LOC~~ SOAJ
0.7500 mg | SUBCUTANEOUS | 0 refills | Status: DC
Start: 2018-05-29 — End: 2018-06-05

## 2018-05-29 MED ORDER — LISINOPRIL 5 MG PO TABS
5.0000 mg | ORAL_TABLET | Freq: Every day | ORAL | 0 refills | Status: DC
Start: 2018-05-29 — End: 2018-09-19

## 2018-05-29 NOTE — Progress Notes (Signed)
Calvin Huber  MRN: [id number] DOB: 02/13/1991  PCP: Mancel Bale, PA-C   Chief Complaint  Patient presents with  . Annual Exam    Subjective:  Pt presents to clinic for a CPE.    Glucose at home - 115-130 - he has been doing really well with his lifestyle changes - he is eating better and exercising  Folliculitis - much better - less pain - still worried that it is not gone  He has not started the crestor yet -   Last dental exam: about 3 years ago Last vision exam: wears glasses -- Last June -  Vaccinations - UTD  Typical meals for patient: 2 meals - lettuce, grilled chicken or steak, half sandwich or wrap Typical beverage choices: diet green tea and water Exercises: 3 times per week for 1 hour Sleeps: 7 hrs per night and sleeping well   Patient Active Problem List   Diagnosis Date Noted  . Uncontrolled type 2 diabetes mellitus without complication, without long-term current use of insulin ([place]) 05/30/2017  . Obesity 09/16/2015  . Hyperglycemia 09/14/2015    Patient Care Team: Mittie Bodo as PCP - General (Physician Assistant)  Review of Systems  Constitutional: Negative.   HENT: Negative.   Eyes: Negative.   Respiratory: Negative.   Cardiovascular: Negative.   Gastrointestinal: Negative.   Endocrine: Negative.   Genitourinary: Negative.   Musculoskeletal: Negative.   Skin: Negative.   Allergic/Immunologic: Negative.   Neurological: Negative.   Hematological: Negative.   Psychiatric/Behavioral: Negative.      Current Outpatient Medications on File Prior to Visit  Medication Sig Dispense Refill  . Insulin Pen Needle 31G X 5 MM MISC 1 each by Does not apply route 2 (two) times daily before a meal. 50 each 1  . rosuvastatin (CRESTOR) 10 MG tablet Take 1 tablet (10 mg total) by mouth daily. (Patient not taking: Reported on 05/29/2018) 90 tablet 3   No current facility-administered medications on file prior to visit.     No Known  Allergies  Social History   Socioeconomic History  . Marital status: Single    Spouse name: Not on file  . Number of children: Not on file  . Years of education: Not on file  . Highest education level: Not on file  Occupational History  . Occupation: Government social research officer  Social Needs  . Financial resource strain: Not on file  . Food insecurity:    Worry: Not on file    Inability: Not on file  . Transportation needs:    Medical: Not on file    Non-medical: Not on file  Tobacco Use  . Smoking status: Never Smoker  . Smokeless tobacco: Never Used  Substance and Sexual Activity  . Alcohol use: No  . Drug use: No  . Sexual activity: Yes    Partners: Female  Lifestyle  . Physical activity:    Days per week: Not on file    Minutes per session: Not on file  . Stress: Not on file  Relationships  . Social connections:    Talks on phone: Not on file    Gets together: Not on file    Attends religious service: Not on file    Active member of club or organization: Not on file    Attends meetings of clubs or organizations: Not on file    Relationship status: Not on file  Other Topics Concern  . 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 KNEE SURGERY      Family History  Problem Relation Age of Onset  . Diabetes Mother   . Diabetes Sister      Objective:  BP 106/64   Pulse 96   Temp 98.7 F (37.1 C) (Oral)   Resp 18   Ht 6' 1.62" (1.87 m)   Wt (!) 307 lb 6.4 oz (139.4 kg)   SpO2 99%   BMI 39.87 kg/m   Physical Exam  Constitutional: He is oriented to person, place, and time.  HENT:  Head: Normocephalic and atraumatic.  Right Ear: Hearing, tympanic membrane, external ear and ear canal normal.  Left Ear: Hearing, tympanic membrane, external ear and ear canal normal.  Nose: Nose normal.  Mouth/Throat: Uvula is midline, oropharynx is clear and moist and  mucous membranes are normal.  Eyes: Pupils are equal, round, and reactive to light. Conjunctivae and EOM are normal.  Neck: Trachea normal and normal range of motion. Neck supple. No thyroid mass and no thyromegaly present.  Cardiovascular: Normal rate, regular rhythm and normal heart sounds.  No murmur heard. Pulmonary/Chest: Effort normal and breath sounds normal.  Abdominal: Soft. Bowel sounds are normal. Hernia confirmed negative in the right inguinal area and confirmed negative in the left inguinal area.  Genitourinary: Testes normal and penis normal. Circumcised.  Musculoskeletal: Normal range of motion.  Neurological: He is alert and oriented to person, place, and time.  Skin: Skin is warm and dry.  Flesh colored papules - suspect sites of healing folliculitis there are 2 areas which appear to be pustular without surrounding erythema -no drainage from any today  Psychiatric: Judgment normal.    Wt Readings from Last 3 Encounters:  05/29/18 (!) 307 lb 6.4 oz (139.4 kg)  04/11/18 (!) 303 lb 12.8 oz (137.8 kg)  05/30/17 (!) 304 lb (137.9 kg)     Visual Acuity Screening   Right eye Left eye Both eyes  Without correction:     With correction: 20/50 20/100 20/50    Assessment and Plan :  Annual physical exam  Elevated LDL cholesterol level -we will wait for recheck at next visit after he has started his Crestor  Uncontrolled type 2 diabetes mellitus without complication, without long-term current use of insulin ([place]) - Plan: glucose blood test strip, CMP14+EGFR, Hemoglobin A1c, lisinopril (PRINIVIL,ZESTRIL) 5 MG tablet, metFORMIN (GLUCOPHAGE XR) 500 MG 24 hr tablet, Dulaglutide (TRULICITY) 1.61 WR/6.0AV SOPN, empagliflozin (JARDIANCE) 25 MG TABS tablet -check labs expect them to be better though not as good as they would have been because not been 2 months since last check though his sugars at home are significantly improved  Rash and nonspecific skin eruption  Folliculitis  barbae - Plan: doxycycline (VIBRA-TABS) 100 MG tablet, triamcinolone cream (KENALOG) 0.5 % -retreat with doxycycline as there are a few pustules present, continues triamcinolone to help with the thickening of skin did discuss with patient that likely we will not have this resulting scarring completely resolved  Screening for thyroid disorder - Plan: TSH  Screening for deficiency anemia - Plan: CBC with Differential/Platelet  Erectile dysfunction, unspecified erectile dysfunction type - Plan: sildenafil (VIAGRA) 100 MG tablet -medications working well would like refill  Decreased visual acuity -patient had his vision and eyeglasses adjusted a little under a year ago, he plans to have this appointment made within the next 2 months.  He  will have eye doctor send me a copy discussing any diabetic retinopathy  Patient verbalized to me that they understand the following: diagnosis, what is being done for them, what to expect and what should be done at home.  Their questions have been answered.  See after visit summary for patient specific instructions.   Windell Hummingbird PA-C  [facility] 05/29/2018 4:57 PM

## 2018-05-29 NOTE — Patient Instructions (Signed)
     IF you received an x-ray today, you will receive an invoice from [facility]. Please contact [facility] at [phone] with questions or concerns regarding your invoice.   IF you received labwork today, you will receive an invoice from [facility]. Please contact [facility] at [phone] with questions or concerns regarding your invoice.   Our billing staff will not be able to assist you with questions regarding bills from these companies.  You will be contacted with the lab results as soon as they are available. The fastest way to get your results is to activate your My Chart account. Instructions are located on the last page of this paperwork. If you have not heard from us regarding the results in 2 weeks, please contact this office.     

## 2018-05-30 LAB — CBC WITH DIFFERENTIAL/PLATELET
Basophils Absolute: 0 10*3/uL (ref 0.0–0.2)
Basos: 0 %
EOS (ABSOLUTE): 0.1 10*3/uL (ref 0.0–0.4)
EOS: 1 %
HEMATOCRIT: 44.7 % (ref 37.5–51.0)
HEMOGLOBIN: 15.2 g/dL (ref 13.0–17.7)
IMMATURE GRANULOCYTES: 0 %
Immature Grans (Abs): 0 10*3/uL (ref 0.0–0.1)
LYMPHS: 35 %
Lymphocytes Absolute: 2.5 10*3/uL (ref 0.7–3.1)
MCH: 27.4 pg (ref 26.6–33.0)
MCHC: 34 g/dL (ref 31.5–35.7)
MCV: 81 fL (ref 79–97)
MONOCYTES: 6 %
Monocytes Absolute: 0.4 10*3/uL (ref 0.1–0.9)
NEUTROS PCT: 58 %
Neutrophils Absolute: 4 10*3/uL (ref 1.4–7.0)
Platelets: 291 10*3/uL (ref 150–450)
RBC: 5.54 x10E6/uL (ref 4.14–5.80)
RDW: 14.3 % (ref 12.3–15.4)
WBC: 6.9 10*3/uL (ref 3.4–10.8)

## 2018-05-30 LAB — CMP14+EGFR
A/G RATIO: 1.6 (ref 1.2–2.2)
ALK PHOS: 46 IU/L (ref 39–117)
ALT: 20 IU/L (ref 0–44)
AST: 15 IU/L (ref 0–40)
Albumin: 4.7 g/dL (ref 3.5–5.5)
BUN/Creatinine Ratio: 13 (ref 9–20)
BUN: 15 mg/dL (ref 6–20)
Bilirubin Total: 0.7 mg/dL (ref 0.0–1.2)
CO2: 21 mmol/L (ref 20–29)
CREATININE: 1.13 mg/dL (ref 0.76–1.27)
Calcium: 10.1 mg/dL (ref 8.7–10.2)
Chloride: 103 mmol/L (ref 96–106)
GFR calc Af Amer: 102 mL/min/{1.73_m2} (ref >59)
GFR calc non Af Amer: 88 mL/min/{1.73_m2} (ref >59)
GLOBULIN, TOTAL: 2.9 g/dL (ref 1.5–4.5)
Glucose: 116 mg/dL — ABNORMAL HIGH (ref 65–99)
POTASSIUM: 4.5 mmol/L (ref 3.5–5.2)
Sodium: 141 mmol/L (ref 134–144)
Total Protein: 7.6 g/dL (ref 6.0–8.5)

## 2018-05-30 LAB — HEMOGLOBIN A1C
ESTIMATED AVERAGE GLUCOSE: 246 mg/dL
HEMOGLOBIN A1C: 10.2 % — AB (ref 4.8–5.6)

## 2018-05-30 LAB — TSH: TSH: 1.88 u[IU]/mL (ref 0.450–4.500)

## 2018-06-05 MED ORDER — DULAGLUTIDE 1.5 MG/0.5ML ~~LOC~~ SOAJ: 1.5000 mg | SUBCUTANEOUS | 0 refills | Status: DC

## 2018-06-05 NOTE — Addendum Note (Signed)
Addended by: Morrell RiddleWEBER, Kiondre Grenz L on: 06/05/2018 03:44 PM   Modules accepted: Orders

## 2018-09-19 ENCOUNTER — Other Ambulatory Visit: Payer: Self-pay | Admitting: Physician Assistant

## 2018-09-19 DIAGNOSIS — L738 Other specified follicular disorders: Secondary | ICD-10-CM

## 2018-09-19 DIAGNOSIS — IMO0001 Reserved for inherently not codable concepts without codable children: Secondary | ICD-10-CM

## 2018-09-19 DIAGNOSIS — E1165 Type 2 diabetes mellitus with hyperglycemia: Principal | ICD-10-CM

## 2018-09-19 MED ORDER — LISINOPRIL 5 MG PO TABS: 5.0000 mg | ORAL_TABLET | Freq: Every day | ORAL | 0 refills | Status: DC

## 2018-10-11 ENCOUNTER — Ambulatory Visit (INDEPENDENT_AMBULATORY_CARE_PROVIDER_SITE_OTHER): Payer: Commercial Managed Care - PPO

## 2018-10-11 ENCOUNTER — Encounter (HOSPITAL_COMMUNITY): Payer: Self-pay | Admitting: *Deleted

## 2018-10-11 ENCOUNTER — Ambulatory Visit (HOSPITAL_COMMUNITY)
Admission: EM | Admit: 2018-10-11 | Discharge: 2018-10-11 | Disposition: A | Discharge status: Home / Self Care | Payer: Commercial Managed Care - PPO | Attending: Family Medicine | Admitting: Family Medicine

## 2018-10-11 DIAGNOSIS — M542 Cervicalgia: Secondary | ICD-10-CM

## 2018-10-11 DIAGNOSIS — R109 Unspecified abdominal pain: Secondary | ICD-10-CM

## 2018-10-11 DIAGNOSIS — M4050 Lordosis, unspecified, site unspecified: Secondary | ICD-10-CM | POA: Diagnosis not present

## 2018-10-11 DIAGNOSIS — R1032 Left lower quadrant pain: Secondary | ICD-10-CM

## 2018-10-11 DIAGNOSIS — M4056 Lordosis, unspecified, lumbar region: Secondary | ICD-10-CM | POA: Diagnosis not present

## 2018-10-11 DIAGNOSIS — S199XXA Unspecified injury of neck, initial encounter: Secondary | ICD-10-CM | POA: Diagnosis not present

## 2018-10-11 LAB — POCT URINALYSIS DIP (DEVICE)
BILIRUBIN URINE: NEGATIVE
GLUCOSE, UA: NEGATIVE mg/dL
HGB URINE DIPSTICK: NEGATIVE
KETONES UR: NEGATIVE mg/dL
LEUKOCYTES UA: NEGATIVE
Nitrite: NEGATIVE
Protein, ur: NEGATIVE mg/dL
SPECIFIC GRAVITY, URINE: 1.025 (ref 1.005–1.030)
Urobilinogen, UA: 0.2 mg/dL (ref 0.0–1.0)
pH: 5.5 (ref 5.0–8.0)

## 2018-10-11 MED ORDER — MELOXICAM 15 MG PO TABS: 15.0000 mg | ORAL_TABLET | Freq: Every day | ORAL | 1 refills | Status: DC

## 2018-10-11 MED ORDER — CYCLOBENZAPRINE HCL 10 MG PO TABS: 10.0000 mg | ORAL_TABLET | Freq: Two times a day (BID) | ORAL | 0 refills | Status: DC | PRN
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
## 2018-10-11 NOTE — ED Notes (Signed)
Per provider, pt offered to either wait in [facility] for XR report, or we can D/C him to home and call him with results.  Pt states he wishes to wait a bit longer in [facility] at this time.
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
## 2018-12-14 DIAGNOSIS — H0288A Meibomian gland dysfunction right eye, upper and lower eyelids: Secondary | ICD-10-CM | POA: Diagnosis not present

## 2018-12-14 DIAGNOSIS — E119 Type 2 diabetes mellitus without complications: Secondary | ICD-10-CM | POA: Diagnosis not present

## 2018-12-14 DIAGNOSIS — H25813 Combined forms of age-related cataract, bilateral: Secondary | ICD-10-CM | POA: Diagnosis not present

## 2018-12-14 LAB — HM DIABETES EYE EXAM

## 2018-12-18 ENCOUNTER — Encounter: Payer: Self-pay | Admitting: Family Medicine

## 2018-12-18 ENCOUNTER — Other Ambulatory Visit: Payer: Self-pay

## 2018-12-18 ENCOUNTER — Ambulatory Visit: Payer: Commercial Managed Care - PPO | Admitting: Family Medicine

## 2018-12-18 VITALS — BP 110/74 | HR 80 | Temp 98.7°F | Resp 16 | Ht 74.0 in | Wt 321.4 lb

## 2018-12-18 DIAGNOSIS — N529 Male erectile dysfunction, unspecified: Secondary | ICD-10-CM

## 2018-12-18 DIAGNOSIS — IMO0001 Reserved for inherently not codable concepts without codable children: Secondary | ICD-10-CM

## 2018-12-18 DIAGNOSIS — E1165 Type 2 diabetes mellitus with hyperglycemia: Secondary | ICD-10-CM | POA: Diagnosis not present

## 2018-12-18 DIAGNOSIS — Z23 Encounter for immunization: Secondary | ICD-10-CM | POA: Diagnosis not present

## 2018-12-18 DIAGNOSIS — E7800 Pure hypercholesterolemia, unspecified: Secondary | ICD-10-CM

## 2018-12-18 LAB — CMP14+EGFR
ALK PHOS: 49 IU/L (ref 39–117)
ALT: 22 IU/L (ref 0–44)
AST: 11 IU/L (ref 0–40)
Albumin/Globulin Ratio: 1.6 (ref 1.2–2.2)
Albumin: 4.4 g/dL (ref 3.5–5.5)
BUN/Creatinine Ratio: 13 (ref 9–20)
BUN: 14 mg/dL (ref 6–20)
Bilirubin Total: 0.3 mg/dL (ref 0.0–1.2)
CALCIUM: 9.9 mg/dL (ref 8.7–10.2)
CO2: 22 mmol/L (ref 20–29)
CREATININE: 1.06 mg/dL (ref 0.76–1.27)
Chloride: 102 mmol/L (ref 96–106)
GFR calc Af Amer: 109 mL/min/{1.73_m2} (ref >59)
GFR, EST NON AFRICAN AMERICAN: 94 mL/min/{1.73_m2} (ref >59)
GLUCOSE: 153 mg/dL — AB (ref 65–99)
Globulin, Total: 2.7 g/dL (ref 1.5–4.5)
Potassium: 4.3 mmol/L (ref 3.5–5.2)
Sodium: 139 mmol/L (ref 134–144)
Total Protein: 7.1 g/dL (ref 6.0–8.5)

## 2018-12-18 LAB — POCT GLYCOSYLATED HEMOGLOBIN (HGB A1C): Hemoglobin A1C: 7.2 % — AB (ref 4.0–5.6)

## 2018-12-18 MED ORDER — TADALAFIL 10 MG PO TABS: ORAL_TABLET | ORAL | 0 refills | Status: DC

## 2018-12-18 NOTE — Assessment & Plan Note (Signed)
Discussed exercise plan and offered encouragement Continue metformin and diabetes management

## 2018-12-18 NOTE — Patient Instructions (Addendum)
Component     Latest Ref Rng & Units 09/15/2015 06/14/2016 05/30/2017  Hemoglobin A1C     4.0 - 5.6 % 14.6 (H) >14.0 >15.5 (H)  Mean Plasma Glucose     mg/dL 372    Est. average glucose Bld gHb Est-mCnc     mg/dL   >398   Component     Latest Ref Rng & Units 05/29/2018 12/18/2018  Hemoglobin A1C     4.0 - 5.6 % 10.2 (H) 7.2 (A)  Mean Plasma Glucose     mg/dL    Est. average glucose Bld gHb Est-mCnc     mg/dL 246       If you have lab work done today you will be contacted with your lab results within the next 2 weeks.  If you have not heard from [place] then please contact us. The fastest way to get your results is to register for My Chart.   IF you received an x-ray today, you will receive an invoice from [facility]. Please contact [facility] at [phone] with questions or concerns regarding your invoice.   IF you received labwork today, you will receive an invoice from [facility]. Please contact [facility] at [phone] with questions or concerns regarding your invoice.   Our billing staff will not be able to assist you with questions regarding bills from these companies.  You will be contacted with the lab results as soon as they are available. The fastest way to get your results is to activate your My Chart account. Instructions are located on the last page of this paperwork. If you have not heard from [place] regarding the results in 2 weeks, please contact this office.     Type 2 Diabetes Mellitus, Self Care, Adult Caring for yourself after you have been diagnosed with type 2 diabetes (type 2 diabetes mellitus) means keeping your blood sugar (glucose) under control with a balance of:  Nutrition.  Exercise.  Lifestyle changes.  Medicines or insulin, if necessary.  Support from your team of health care providers and others. The following information explains what you need to know to manage your diabetes at home. What are the risks? Having diabetes can put you  at risk for other long-term (chronic) conditions, such as heart disease and kidney disease. Your health care provider may prescribe medicines to help prevent complications from diabetes. These medicines may include:  Aspirin.  Medicine to lower cholesterol.  Medicine to control blood pressure. How to monitor blood glucose   Check your blood glucose every day, as often as told by your health care provider.  Have your A1c (hemoglobin A1c) level checked two or more times a year, or as often as told by your health care provider. Your health care provider will set individualized treatment goals for you. Generally, the goal of treatment is to maintain the following blood glucose levels:  Before meals (preprandial): 80-130 mg/dL (4.4-7.2 mmol/L).  After meals (postprandial): below 180 mg/dL (10 mmol/L).  A1c level: less than 7%. How to manage hyperglycemia and hypoglycemia Hyperglycemia symptoms Hyperglycemia, also called high blood glucose, occurs when blood glucose is too high. Make sure you know the early signs of hyperglycemia, such as:  Increased thirst.  Hunger.  Feeling very tired.  Needing to urinate more often than usual.  Blurry vision. Hypoglycemia symptoms Hypoglycemia, also called low blood glucose, occurswith a blood glucose level at or below 70 mg/dL (3.9 mmol/L). The risk for hypoglycemia increases during or after exercise, during sleep, during illness,  and when skipping meals or not eating for a long time (fasting). It is important to know the symptoms of hypoglycemia and treat it right away. Always have a 15-gram rapid-acting carbohydrate snack with you to treat low blood glucose. Family members and close friends should also know the symptoms and should understand how to treat hypoglycemia, in case you are not able to treat yourself. Symptoms may include:  Hunger.  Anxiety.  Sweating and feeling clammy.  Confusion.  Dizziness or feeling  light-headed.  Sleepiness.  Nausea.  Increased heart rate.  Headache.  Blurry vision.  Irritability.  A change in coordination.  Tingling or numbness around the mouth, lips, or tongue.  Restless sleep.  Fainting.  Seizure. Treating hypoglycemia If you are alert and able to swallow safely, follow the 15:15 rule:  Take 15 grams of a rapid-acting carbohydrate. Talk with your health care provider about how much you should take.  Rapid-acting options include: ? Glucose pills (take 15 grams). ? 6-8 pieces of hard candy. ? 4-6 oz (120-150 mL) of fruit juice. ? 4-6 oz (120-150 mL) of regular (not diet) soda. ? 1 Tbsp (15 mL) honey or sugar.  Check your blood glucose 15 minutes after you take the carbohydrate.  If the repeat blood glucose level is still at or below 70 mg/dL (3.9 mmol/L), take 15 grams of a carbohydrate again.  If your blood glucose level does not increase above 70 mg/dL (3.9 mmol/L) after 3 tries, seek emergency medical care.  After your blood glucose level returns to normal, eat a meal or a snack within 1 hour. Treating severe hypoglycemia Severe hypoglycemia is when your blood glucose level is at or below 54 mg/dL (3 mmol/L). Severe hypoglycemia is an emergency. Do not wait to see if the symptoms will go away. Get medical help right away. Call your local emergency services (911 in the [place] If you have severe hypoglycemia and you cannot eat or drink, you may need an injection of glucagon. A family member or close friend should learn how to check your blood glucose and how to give you a glucagon injection. Ask your health care provider if you need to have an emergency glucagon injection kit available. Severe hypoglycemia may need to be treated in a hospital. The treatment may include getting glucose through an IV. You may also need treatment for the cause of your hypoglycemia. Follow these instructions at home: Take diabetes medicines as told  If your health  care provider prescribed insulin or diabetes medicines, take them every day.  Do not run out of insulin or other diabetes medicines that you take. Plan ahead so you always have these available.  If you use insulin, adjust your dosage based on how physically active you are and what foods you eat. Your health care provider will tell you how to adjust your dosage. Make healthy food choices  The things that you eat and drink affect your blood glucose and your insulin dosage. Making good choices helps to control your diabetes and prevent other health problems. A healthy meal plan includes eating lean proteins, complex carbohydrates, fresh fruits and vegetables, low-fat dairy products, and healthy fats. Make an appointment to see a diet and nutrition specialist (registered dietitian) to help you create an eating plan that is right for you. Make sure that you:  Follow instructions from your health care provider about eating or drinking restrictions.  Drink enough fluid to keep your urine pale yellow.  Keep a record of the carbohydrates  that you eat. Do this by reading food labels and learning the standard serving sizes of foods.  Follow your sick day plan whenever you cannot eat or drink as usual. Make this plan in advance with your health care provider.  Stay active Exercise regularly, as told by your health care provider. This may include:  Stretching and doing strength exercises, such as yoga or weightlifting, 2 or more times a week.  Doing 150 minutes or more of moderate-intensity or vigorous-intensity exercise each week. This could be brisk walking, biking, or water aerobics. ? Spread out your activity over 3 or more days of the week. ? Do not go more than 2 days in a row without doing some kind of physical activity. When you start a new exercise or activity, work with your health care provider to adjust your insulin, medicines, or food intake as needed. Make healthy lifestyle choices  Do  not use any tobacco products, such as cigarettes, chewing tobacco, and e-cigarettes. If you need help quitting, ask your health care provider.  If your health care provider says that alcohol is safe for you, limit alcohol intake to no more than 1 drink per day for nonpregnant women and 2 drinks per day for men. One drink equals 12 oz of beer (355 mL), 5 oz of wine (148 mL), or 1 oz of hard liquor (44 mL).  Learn to manage stress. If you need help with this, ask your health care provider. Care for your body   Keep your immunizations up to date. In addition to getting vaccinations as told by your health care provider, it is recommended that you get vaccinated against the following illnesses: ? The flu (influenza). Get a flu shot every year. ? Pneumonia. ? Hepatitis B.  Schedule an eye exam soon after your diagnosis, and then one time every year after that.  Check your skin and feet every day for cuts, bruises, redness, blisters, or sores. Schedule a foot exam with your health care provider once every year.  Brush your teeth and gums two times a day, and floss one or more times a day. Visit your dentist one or more times every 6 months.  Maintain a healthy weight. General instructions  Take over-the-counter and prescription medicines only as told by your health care provider.  Share your diabetes management plan with people in your workplace, school, and household.  Carry a medical alert card or wear medical alert jewelry.  Keep all follow-up visits as told by your health care provider. This is important. Questions to ask your health care provider  Do I need to meet with a diabetes educator?  Where can I find a support group for people with diabetes? Where to find more information For more information about diabetes, visit:  American Diabetes Association (ADA): www.diabetes.org  American Association of Diabetes Educators (AADE): www.diabeteseducator.org Summary  Caring for  yourself after you have been diagnosed with (type 2 diabetes mellitus) means keeping your blood sugar (glucose) under control with a balance of nutrition, exercise, lifestyle changes, and medicine.  Check your blood glucose every day, as often as told by your health care provider.  Having diabetes can put you at risk for other long-term (chronic) conditions, such as heart disease and kidney disease. Your health care provider may prescribe medicines to help prevent complications from diabetes.  Keep all follow-up visits as told by your health care provider. This is important. This information is not intended to replace advice given to you by your  health care provider. Make sure you discuss any questions you have with your health care provider. Document Released: 04/01/2016 Document Revised: 06/01/2018 Document Reviewed: 01/12/2016 Elsevier Interactive Patient Education  2019 [employer].

## 2018-12-18 NOTE — Assessment & Plan Note (Signed)
Discussed diabetes management a1c much improved on current medications and off patient executed plan to stop insulin Will continue to do standard of care for diabetes Pt on Crestor, Lisinopril, Metformin, Jardiance Will stop lisinopril for a 3 months to see if erectile dysfunction

## 2018-12-18 NOTE — Progress Notes (Signed)
Established Patient Office Visit  Subjective:  Patient ID: Calvin Huber, male    DOB: 30-Jan-1991  Age: 27 y.o. MRN: [id number]  CC:  Chief Complaint  Patient presents with  . Medication Refill    ALL MEDICATION and Insulin pens    HPI Calvin Huber presents for    Diabetes Type 2 Patient reports that he felt like his number were getting better so he stopped his insulin a year ago because he did not like pricking himself.  He checked his fasting glucose this morning and it was 165 fasting His typical readings are 140 to 160 On metformin 1073m bid and Jardiance 25 mg He has cataracts but not glaucoma or diabetic neuropathy   Component     Latest Ref Rng & Units 09/15/2015 06/14/2016 05/30/2017  Hemoglobin A1C     4.0 - 5.6 % 14.6 (H) >14.0 >15.5 (H)  Mean Plasma Glucose     mg/dL 372    Est. average glucose Bld gHb Est-mCnc     mg/dL   >398   Component     Latest Ref Rng & Units 05/29/2018 12/18/2018  Hemoglobin A1C     4.0 - 5.6 % 10.2 (H) 7.2 (A)  Mean Plasma Glucose     mg/dL    Est. average glucose Bld gHb Est-mCnc     mg/dL 246    Morbid Obesity Wt Readings from Last 3 Encounters:  12/18/18 (!) 321 lb 6.4 oz (145.8 kg)  05/29/18 (!) 307 lb 6.4 oz (139.4 kg)  04/11/18 (!) 303 lb 12.8 oz (137.8 kg)   Patient reports that he is now lifting weights and is eating less starches  He has been out of his Jardiance due to running out of refills and is not taking metformin for the past week and could feel like his exercise was not the same nor his energy. He is not drinking sodas  Erectile Dysfunction Pt not getting any erections and states that he is not sure if it from the medications He feels like the impotence is just the same and is affecting his ability to penetrate He does not remain erect the whole time Denies any side effects from sildenafil    Past Medical History:  Diagnosis Date  . Allergy   . Diabetes mellitus without complication ([facility]      Past Surgical History:  Procedure Laterality Date  . KNEE SURGERY      Family History  Problem Relation Age of Onset  . Diabetes Mother   . Diabetes Sister     Social History   Socioeconomic History  . Marital status: Single    Spouse name: Not on file  . Number of children: Not on file  . Years of education: Not on file  . Highest education level: Not on file  Occupational History  . Occupation: pGovernment social research officer Social Needs  . Financial resource strain: Not on file  . Food insecurity:    Worry: Not on file    Inability: Not on file  . Transportation needs:    Medical: Not on file    Non-medical: Not on file  Tobacco Use  . Smoking status: Never Smoker  . Smokeless tobacco: Never Used  Substance and Sexual Activity  . Alcohol use: No  . Drug use: No  . Sexual activity: Yes    Partners: Female  Lifestyle  . Physical activity:    Days per week: Not on file    Minutes per session:  Not on file  . Stress: Not on file  Relationships  . Social connections:    Talks on phone: Not on file    Gets together: Not on file    Attends religious service: Not on file    Active member of club or organization: Not on file    Attends meetings of clubs or organizations: Not on file    Relationship status: Not on file  . Intimate partner violence:    Fear of current or ex partner: Not on file    Emotionally abused: Not on file    Physically abused: Not on file    Forced sexual activity: Not on file  Other Topics Concern  . Not on file  Social History Narrative   Probation officer   Lives with mother       Outpatient Medications Prior to Visit  Medication Sig Dispense Refill  . cyclobenzaprine (FLEXERIL) 10 MG tablet Take 1 tablet (10 mg total) by mouth 2 (two) times daily as needed for muscle spasms (an neck pain/stiffness). 20 tablet 0  . empagliflozin (JARDIANCE) 25 MG TABS tablet Take 25 mg by mouth daily. 30 tablet 2  . lisinopril (PRINIVIL,ZESTRIL)  5 MG tablet Take 1 tablet (5 mg total) by mouth daily. 90 tablet 0  . meloxicam (MOBIC) 15 MG tablet Take 1 tablet (15 mg total) by mouth daily. 30 tablet 1  . metFORMIN (GLUCOPHAGE XR) 500 MG 24 hr tablet Take 2 tablets (1,000 mg total) by mouth 2 (two) times daily. 540 tablet 0  . rosuvastatin (CRESTOR) 10 MG tablet Take 1 tablet (10 mg total) by mouth daily. 90 tablet 3  . sildenafil (VIAGRA) 100 MG tablet Take 1 tablet (100 mg total) by mouth daily as needed for erectile dysfunction. 10 tablet 2  . triamcinolone cream (KENALOG) 0.5 % Apply 1 application topically 2 (two) times daily. 45 g 0  . glucose blood test strip Use as instructed 100 each 12  . Insulin Pen Needle 31G X 5 MM MISC 1 each by Does not apply route 2 (two) times daily before a meal. 50 each 1   No facility-administered medications prior to visit.     No Known Allergies  ROS Review of Systems Review of Systems  Constitutional: Negative for activity change, appetite change, chills and fever.  HENT: Negative for congestion, nosebleeds, trouble swallowing and voice change.   Respiratory: Negative for cough, shortness of breath and wheezing.   Gastrointestinal: Negative for diarrhea, nausea and vomiting.  Genitourinary: Negative for difficulty urinating, dysuria, flank pain and hematuria.  Musculoskeletal: Negative for back pain, joint swelling and neck pain.  Neurological: Negative for dizziness, speech difficulty, light-headedness and numbness.  See HPI. All other review of systems negative.     Objective:    Physical Exam  BP 110/74   Pulse 80   Temp 98.7 F (37.1 C) (Oral)   Resp 16   Ht 6' 2"  (1.88 m)   Wt (!) 321 lb 6.4 oz (145.8 kg)   SpO2 98%   BMI 41.27 kg/m  Wt Readings from Last 3 Encounters:  12/18/18 (!) 321 lb 6.4 oz (145.8 kg)  05/29/18 (!) 307 lb 6.4 oz (139.4 kg)  04/11/18 (!) 303 lb 12.8 oz (137.8 kg)   Physical Exam  Constitutional: Oriented to person, place, and time. Appears  well-developed and well-nourished.  HENT:  Head: Normocephalic and atraumatic.  Eyes: Conjunctivae and EOM are normal.  Cardiovascular: Normal rate, regular rhythm, normal heart sounds  and intact distal pulses.  No murmur heard. Pulmonary/Chest: Effort normal and breath sounds normal. No stridor. No respiratory distress. Has no wheezes.  Neurological: Is alert and oriented to person, place, and time.  Skin: Skin is warm. Capillary refill takes less than 2 seconds.  Psychiatric: Has a normal mood and affect. Behavior is normal. Judgment and thought content normal.    Health Maintenance Due  Topic Date Due  . PNEUMOCOCCAL POLYSACCHARIDE VACCINE AGE 74-64 HIGH RISK  08/08/1991  . FOOT EXAM  05/30/2018  . OPHTHALMOLOGY EXAM  11/22/2018  . HEMOGLOBIN A1C  11/28/2018    There are no preventive care reminders to display for this patient.  Lab Results  Component Value Date   TSH 1.880 05/29/2018   Lab Results  Component Value Date   WBC 6.9 05/29/2018   HGB 15.2 05/29/2018   HCT 44.7 05/29/2018   MCV 81 05/29/2018   PLT 291 05/29/2018   Lab Results  Component Value Date   NA 141 05/29/2018   K 4.5 05/29/2018   CO2 21 05/29/2018   GLUCOSE 116 (H) 05/29/2018   BUN 15 05/29/2018   CREATININE 1.13 05/29/2018   BILITOT 0.7 05/29/2018   ALKPHOS 46 05/29/2018   AST 15 05/29/2018   ALT 20 05/29/2018   PROT 7.6 05/29/2018   ALBUMIN 4.7 05/29/2018   CALCIUM 10.1 05/29/2018   ANIONGAP 11 06/14/2016   Lab Results  Component Value Date   CHOL 198 04/11/2018   Lab Results  Component Value Date   HDL 40 04/11/2018   Lab Results  Component Value Date   LDLCALC 137 (H) 04/11/2018   Lab Results  Component Value Date   TRIG 107 04/11/2018   Lab Results  Component Value Date   CHOLHDL 5.0 04/11/2018   Lab Results  Component Value Date   HGBA1C 7.2 (A) 12/18/2018      Assessment & Plan:   Problem List Items Addressed This Visit      Endocrine   Uncontrolled type 2  diabetes mellitus without complication, without long-term current use of insulin ([place]) - Primary    Discussed diabetes management a1c much improved on current medications and off patient executed plan to stop insulin Will continue to do standard of care for diabetes Pt on Crestor, Lisinopril, Metformin, Jardiance Will stop lisinopril for a 3 months to see if erectile dysfunction      Relevant Orders   HM Diabetes Foot Exam (Completed)   POCT glycosylated hemoglobin (Hb A1C) (Completed)   CMP14+EGFR   Lipid panel     Other   Morbid obesity ([place])    Discussed exercise plan and offered encouragement Continue metformin and diabetes management      Erectile dysfunction    Discussed holiday from lisinopril and changed medication to cialis If there is important in ED will continue cialis and resume lisinopril at 2.23m for renal protection       Relevant Medications   tadalafil (CIALIS) 10 MG tablet    Other Visit Diagnoses    Need for prophylactic vaccination and inoculation against influenza       Relevant Orders   Flu Vaccine QUAD 36+ mos IM (Completed)   Need for 23-polyvalent pneumococcal polysaccharide vaccine       Relevant Orders   Pneumococcal polysaccharide vaccine 23-valent greater than or equal to 2yo subcutaneous/IM (Completed)      Meds ordered this encounter  Medications  . tadalafil (CIALIS) 10 MG tablet    Sig: Use 0.5 tablet  to 1 tablet by mouth every other day as needed for erection    Dispense:  10 tablet    Refill:  0    Follow-up: Return in about 3 months (around 03/19/2019).    Forrest Moron, MD

## 2018-12-18 NOTE — Assessment & Plan Note (Signed)
Discussed holiday from lisinopril and changed medication to cialis If there is important in ED will continue cialis and resume lisinopril at 2.5mg  for renal protection

## 2018-12-19 LAB — LIPID PANEL
CHOLESTEROL TOTAL: 170 mg/dL (ref 100–199)
Chol/HDL Ratio: 4 ratio (ref 0.0–5.0)
HDL: 42 mg/dL (ref >39)
LDL CALC: 106 mg/dL — AB (ref 0–99)
TRIGLYCERIDES: 109 mg/dL (ref 0–149)
VLDL CHOLESTEROL CAL: 22 mg/dL (ref 5–40)

## 2018-12-21 DIAGNOSIS — H25812 Combined forms of age-related cataract, left eye: Secondary | ICD-10-CM | POA: Diagnosis not present

## 2018-12-21 DIAGNOSIS — H2512 Age-related nuclear cataract, left eye: Secondary | ICD-10-CM | POA: Diagnosis not present

## 2018-12-29 MED ORDER — LISINOPRIL 5 MG PO TABS: 5.0000 mg | ORAL_TABLET | Freq: Every day | ORAL | 0 refills | Status: DC

## 2018-12-29 MED ORDER — MELOXICAM 15 MG PO TABS: 15.0000 mg | ORAL_TABLET | Freq: Every day | ORAL | 3 refills | Status: DC

## 2018-12-29 MED ORDER — METFORMIN HCL ER 500 MG PO TB24: 1000.0000 mg | ORAL_TABLET | Freq: Two times a day (BID) | ORAL | 1 refills | Status: DC

## 2018-12-29 MED ORDER — EMPAGLIFLOZIN 25 MG PO TABS: 25.0000 mg | ORAL_TABLET | Freq: Every day | ORAL | 1 refills | Status: DC

## 2018-12-29 MED ORDER — ROSUVASTATIN CALCIUM 10 MG PO TABS: 10.0000 mg | ORAL_TABLET | Freq: Every day | ORAL | 3 refills | Status: DC
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
## 2019-03-01 DIAGNOSIS — M6080 Other myositis, unspecified site: Secondary | ICD-10-CM | POA: Diagnosis not present

## 2019-03-01 DIAGNOSIS — M545 Low back pain: Secondary | ICD-10-CM | POA: Diagnosis not present

## 2019-03-01 DIAGNOSIS — M62838 Other muscle spasm: Secondary | ICD-10-CM | POA: Diagnosis not present

## 2019-03-13 ENCOUNTER — Other Ambulatory Visit: Payer: Self-pay | Admitting: *Deleted

## 2019-03-13 DIAGNOSIS — E7800 Pure hypercholesterolemia, unspecified: Secondary | ICD-10-CM

## 2019-03-13 DIAGNOSIS — IMO0001 Reserved for inherently not codable concepts without codable children: Secondary | ICD-10-CM

## 2019-03-13 DIAGNOSIS — E1165 Type 2 diabetes mellitus with hyperglycemia: Principal | ICD-10-CM

## 2019-03-19 ENCOUNTER — Other Ambulatory Visit: Payer: Self-pay

## 2019-03-19 ENCOUNTER — Telehealth (INDEPENDENT_AMBULATORY_CARE_PROVIDER_SITE_OTHER): Payer: Commercial Managed Care - PPO | Admitting: Family Medicine

## 2019-03-19 DIAGNOSIS — E1165 Type 2 diabetes mellitus with hyperglycemia: Secondary | ICD-10-CM | POA: Diagnosis not present

## 2019-03-19 DIAGNOSIS — N529 Male erectile dysfunction, unspecified: Secondary | ICD-10-CM | POA: Diagnosis not present

## 2019-03-19 DIAGNOSIS — Z760 Encounter for issue of repeat prescription: Secondary | ICD-10-CM | POA: Diagnosis not present

## 2019-03-19 MED ORDER — SILDENAFIL CITRATE 100 MG PO TABS: 100.0000 mg | ORAL_TABLET | Freq: Every day | ORAL | 2 refills | Status: DC | PRN

## 2019-03-19 MED ORDER — TADALAFIL 10 MG PO TABS: ORAL_TABLET | ORAL | 6 refills | Status: DC

## 2019-03-19 NOTE — Progress Notes (Signed)
Cc: 3 MONTH F/U DIABETES.  No travel outside the country or [place], no fall or depression.  Pt requesting refill on viagra and cialis.  Pt is not taking lisinopril on daily- per pt he takes it sometimes.  No other concerns for MD per pt.

## 2019-03-19 NOTE — Patient Instructions (Signed)
° ° ° °  If you have lab work done today you will be contacted with your lab results within the next 2 weeks.  If you have not heard from us then please contact us. The fastest way to get your results is to register for My Chart. ° ° °IF you received an x-ray today, you will receive an invoice from [facility]. Please contact  Radiology at [phone] with questions or concerns regarding your invoice.  ° °IF you received labwork today, you will receive an invoice from [facility]. Please contact [facility] at [phone] with questions or concerns regarding your invoice.  ° °Our billing staff will not be able to assist you with questions regarding bills from these companies. ° °You will be contacted with the lab results as soon as they are available. The fastest way to get your results is to activate your My Chart account. Instructions are located on the last page of this paperwork. If you have not heard from us regarding the results in 2 weeks, please contact this office. °  ° ° ° °

## 2019-03-19 NOTE — Progress Notes (Signed)
Telemedicine Encounter- SOAP NOTE Established Patient  This telephone encounter was conducted with the patient's (or proxy's) verbal consent via audio telecommunications: yes/no: Yes Patient was instructed to have this encounter in a suitably private space; and to only have persons present to whom they give permission to participate. In addition, patient identity was confirmed by use of name plus two identifiers (DOB and address).  I discussed the limitations, risks, security and privacy concerns of performing an evaluation and management service by telephone and the availability of in person appointments. I also discussed with the patient that there may be a patient responsible charge related to this service. The patient expressed understanding and agreed to proceed.  I spent a total of TIME; 0 MIN TO 60 MIN: 20 minutes talking with the patient or their proxy.  CC: diabetes  Subjective   Calvin Huber is a 28 y.o. established patient. Telephone visit today for  HPI  Diabetes Mellitus: Patient presents for follow up of diabetes. Symptoms: hyperglycemia, polyuria. Symptoms have stabilized. Patient denies foot ulcerations, hypoglycemia , increase appetite, nausea, paresthesia of the feet, polydipsia and vomitting.  Evaluation to date has been included: hemoglobin A1C.  Home sugars: BGs range between 120 and 180.  Lab Results  Component Value Date   HGBA1C 7.2 (A) 12/18/2018   Erectile Dysfunction Patient reports that he has been doing well and alternates between viagra and cialis. He states that he only gets 4 pills at a time He is mostly at home due to coronavirus   Patient Active Problem List   Diagnosis Date Noted  . Erectile dysfunction 12/18/2018  . Type 2 diabetes mellitus with hyperglycemia, without long-term current use of insulin (HCC) 05/30/2017  . Morbid obesity (HCC) 09/16/2015  . Hyperglycemia 09/14/2015    Past Medical History:  Diagnosis Date  . Allergy   .  Diabetes mellitus without complication (HCC)     Current Outpatient Medications  Medication Sig Dispense Refill  . cyclobenzaprine (FLEXERIL) 10 MG tablet Take 1 tablet (10 mg total) by mouth 2 (two) times daily as needed for muscle spasms (an neck pain/stiffness). 20 tablet 0  . empagliflozin (JARDIANCE) 25 MG TABS tablet Take 25 mg by mouth daily. 90 tablet 1  . glucose blood test strip Use as instructed 100 each 12  . Insulin Pen Needle 31G X 5 MM MISC 1 each by Does not apply route 2 (two) times daily before a meal. 50 each 1  . meloxicam (MOBIC) 15 MG tablet Take 1 tablet (15 mg total) by mouth daily. 30 tablet 3  . metFORMIN (GLUCOPHAGE XR) 500 MG 24 hr tablet Take 2 tablets (1,000 mg total) by mouth 2 (two) times daily. 360 tablet 1  . rosuvastatin (CRESTOR) 10 MG tablet Take 1 tablet (10 mg total) by mouth daily. 90 tablet 3  . sildenafil (VIAGRA) 100 MG tablet Take 1 tablet (100 mg total) by mouth daily as needed for erectile dysfunction. 10 tablet 2  . tadalafil (CIALIS) 10 MG tablet Use 0.5 tablet to 1 tablet by mouth every other day as needed for erection 10 tablet 6  . triamcinolone cream (KENALOG) 0.5 % Apply 1 application topically 2 (two) times daily. 45 g 0  . lisinopril (PRINIVIL,ZESTRIL) 5 MG tablet Take 1 tablet (5 mg total) by mouth daily. (Patient not taking: Reported on 03/19/2019) 90 tablet 0   No current facility-administered medications for this visit.     No Known Allergies  Social History   Socioeconomic  History  . Marital status: Single    Spouse name: Not on file  . Number of children: Not on file  . Years of education: Not on file  . Highest education level: Not on file  Occupational History  . Occupation: Emergency planning/management officer  Social Needs  . Financial resource strain: Not on file  . Food insecurity:    Worry: Not on file    Inability: Not on file  . Transportation needs:    Medical: Not on file    Non-medical: Not on file  Tobacco Use  . Smoking  status: Never Smoker  . Smokeless tobacco: Never Used  Substance and Sexual Activity  . Alcohol use: No  . Drug use: No  . Sexual activity: Yes    Partners: Female  Lifestyle  . Physical activity:    Days per week: Not on file    Minutes per session: Not on file  . Stress: Not on file  Relationships  . Social connections:    Talks on phone: Not on file    Gets together: Not on file    Attends religious service: Not on file    Active member of club or organization: Not on file    Attends meetings of clubs or organizations: Not on file    Relationship status: Not on file  . Intimate partner violence:    Fear of current or ex partner: Not on file    Emotionally abused: Not on file    Physically abused: Not on file    Forced sexual activity: Not on file  Other Topics Concern  . 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  Diagnoses and all orders for this visit:  Type 2 diabetes mellitus with hyperglycemia, without long-term current use of insulin (HCC)-  Diabetes stable  Discussed goal of a1c goal of <7% Continue current medications  Erectile dysfunction, unspecified erectile dysfunction type- refilled medication -     sildenafil (VIAGRA) 100 MG tablet; Take 1 tablet (100 mg total) by mouth daily as needed for erectile dysfunction. -     tadalafil (CIALIS) 10 MG tablet; Use 0.5 tablet to 1 tablet by mouth every other day as needed for erection     I discussed the assessment and treatment plan with the patient. The patient was provided an opportunity to ask questions and all were answered. The patient agreed with the plan and demonstrated an understanding of the instructions.   The patient was advised to call back or seek an in-person evaluation if the symptoms worsen or if the condition fails to improve as anticipated.   I provided 20 minutes of non-face-to-face time during this encounter.  Doristine Bosworth, MD  Primary Care at [facility]

## 2019-03-22 ENCOUNTER — Other Ambulatory Visit: Payer: Self-pay

## 2019-03-22 ENCOUNTER — Ambulatory Visit (INDEPENDENT_AMBULATORY_CARE_PROVIDER_SITE_OTHER): Payer: Commercial Managed Care - PPO | Admitting: Family Medicine

## 2019-03-22 DIAGNOSIS — E1165 Type 2 diabetes mellitus with hyperglycemia: Secondary | ICD-10-CM

## 2019-03-22 DIAGNOSIS — E7800 Pure hypercholesterolemia, unspecified: Secondary | ICD-10-CM | POA: Diagnosis not present

## 2019-03-22 DIAGNOSIS — IMO0001 Reserved for inherently not codable concepts without codable children: Secondary | ICD-10-CM

## 2019-03-22 LAB — GLUCOSE, POCT (MANUAL RESULT ENTRY)

## 2019-03-23 LAB — LIPID PANEL
Chol/HDL Ratio: 5.4 ratio — ABNORMAL HIGH (ref 0.0–5.0)
Cholesterol, Total: 168 mg/dL (ref 100–199)
HDL: 31 mg/dL — ABNORMAL LOW (ref >39)
Triglycerides: 709 mg/dL (ref 0–149)

## 2019-03-23 LAB — HEMOGLOBIN A1C
Est. average glucose Bld gHb Est-mCnc: 275 mg/dL
Hgb A1c MFr Bld: 11.2 % — ABNORMAL HIGH (ref 4.8–5.6)

## 2019-03-23 LAB — COMPREHENSIVE METABOLIC PANEL
ALK PHOS: 79 IU/L (ref 39–117)
ALT: 23 IU/L (ref 0–44)
AST: 19 IU/L (ref 0–40)
Albumin/Globulin Ratio: 1.3 (ref 1.2–2.2)
Albumin: 4.7 g/dL (ref 4.1–5.2)
BILIRUBIN TOTAL: 0.3 mg/dL (ref 0.0–1.2)
BUN / CREAT RATIO: 14 (ref 9–20)
BUN: 18 mg/dL (ref 6–20)
CHLORIDE: 96 mmol/L (ref 96–106)
CO2: 16 mmol/L — ABNORMAL LOW (ref 20–29)
Calcium: 9.7 mg/dL (ref 8.7–10.2)
Creatinine, Ser: 1.28 mg/dL — ABNORMAL HIGH (ref 0.76–1.27)
GFR calc Af Amer: 87 mL/min/{1.73_m2} (ref >59)
GFR calc non Af Amer: 75 mL/min/{1.73_m2} (ref >59)
GLUCOSE: 451 mg/dL — AB (ref 65–99)
Globulin, Total: 3.5 g/dL (ref 1.5–4.5)
POTASSIUM: 4.8 mmol/L (ref 3.5–5.2)
Sodium: 135 mmol/L (ref 134–144)
Total Protein: 8.2 g/dL (ref 6.0–8.5)

## 2019-03-23 LAB — MICROALBUMIN, URINE: Microalbumin, Urine: 3 ug/mL

## 2019-07-25 ENCOUNTER — Encounter: Payer: Self-pay | Admitting: Family Medicine

## 2019-07-27 ENCOUNTER — Other Ambulatory Visit: Payer: Self-pay

## 2019-07-27 DIAGNOSIS — IMO0001 Reserved for inherently not codable concepts without codable children: Secondary | ICD-10-CM

## 2019-07-27 MED ORDER — JARDIANCE 25 MG PO TABS: 25.0000 mg | ORAL_TABLET | Freq: Every day | ORAL | 1 refills | Status: DC

## 2019-07-27 MED ORDER — METFORMIN HCL ER 500 MG PO TB24: 1000.0000 mg | ORAL_TABLET | Freq: Two times a day (BID) | ORAL | 1 refills | Status: DC

## 2019-07-28 ENCOUNTER — Other Ambulatory Visit: Payer: Self-pay | Admitting: Family Medicine

## 2019-07-28 DIAGNOSIS — E7800 Pure hypercholesterolemia, unspecified: Secondary | ICD-10-CM

## 2019-07-28 DIAGNOSIS — N529 Male erectile dysfunction, unspecified: Secondary | ICD-10-CM

## 2019-07-28 DIAGNOSIS — IMO0001 Reserved for inherently not codable concepts without codable children: Secondary | ICD-10-CM

## 2019-07-28 MED ORDER — SILDENAFIL CITRATE 100 MG PO TABS: 100.0000 mg | ORAL_TABLET | Freq: Every day | ORAL | 0 refills | Status: DC | PRN

## 2019-07-28 MED ORDER — ROSUVASTATIN CALCIUM 10 MG PO TABS: 10.0000 mg | ORAL_TABLET | Freq: Every day | ORAL | 0 refills | Status: DC

## 2019-07-28 MED ORDER — LISINOPRIL 5 MG PO TABS: 5.0000 mg | ORAL_TABLET | Freq: Every day | ORAL | 0 refills | Status: DC

## 2019-07-28 NOTE — Telephone Encounter (Signed)
Requested Prescriptions  Pending Prescriptions Disp Refills  . sildenafil (VIAGRA) 100 MG tablet 10 tablet 0    Sig: Take 1 tablet (100 mg total) by mouth daily as needed for erectile dysfunction.     Urology: Erectile Dysfunction Agents Passed - 07/28/2019  1:21 PM      Passed - Last BP in normal range    BP Readings from Last 1 Encounters:  12/18/18 110/74         Passed - Valid encounter within last 12 months    Recent Outpatient Visits          4 months ago Uncontrolled type 2 diabetes mellitus without complication, without long-term current use of insulin (HCC)   [facility] Zoe A, MD   7 months ago Uncontrolled type 2 diabetes mellitus without complication, without long-term current use of insulin [facility](HCC)   [facility], Manus RuddZoe A, MD   1 year ago Annual physical exam   [facility], Dema SeverinSarah L, PA-C   1 year ago Uncontrolled type 2 diabetes mellitus without complication, without long-term current use of insulin [facility](HCC)   [facility], Dema SeverinSarah L, PA-C   2 years ago Annual physical exam   [facility], Dema SeverinSarah L, PA-C      Future Appointments            In 2 weeks Janeece AgeeMorrow, Richard, NP [facility] [place], [facility]           . rosuvastatin (CRESTOR) 10 MG tablet 90 tablet 0    Sig: Take 1 tablet (10 mg total) by mouth daily.     Cardiovascular:  Antilipid - Statins Failed - 07/28/2019  1:21 PM      Failed - HDL in normal range and within 360 days    HDL  Date Value Ref Range Status  03/22/2019 31 (L) >39 mg/dL Final         Failed - Triglycerides in normal range and within 360 days    Triglycerides  Date Value Ref Range Status  03/22/2019 709 (HH) 0 - 149 mg/dL Final         Passed - Total Cholesterol in normal range and within 360 days    Cholesterol, Total  Date Value Ref Range Status  03/22/2019 168 100 - 199 mg/dL Final         Passed - LDL in normal range and within 360 days    LDL  Calculated  Date Value Ref Range Status  03/22/2019 Comment 0 - 99 mg/dL Final    Comment:    Triglyceride result indicated is too high for an accurate LDL cholesterol estimation.          Passed - Patient is not pregnant      Passed - Valid encounter within last 12 months    Recent Outpatient Visits          4 months ago Uncontrolled type 2 diabetes mellitus without complication, without long-term current use of insulin (HCC)   [facility] Zoe A, MD   7 months ago Uncontrolled type 2 diabetes mellitus without complication, without long-term current use of insulin [facility](HCC)   [facility], Manus RuddZoe A, MD   1 year ago Annual physical exam   [facility], Dema SeverinSarah L, PA-C   1 year ago Uncontrolled type 2 diabetes mellitus without complication, without long-term current use of insulin (  [facility])   Primary Care at [place], PA-C   2 years ago Annual physical exam   [facility] at [facility], Damaris Hippo, PA-C      Future Appointments            In 2 weeks Maximiano Coss, NP Primary Care at [place], [facility]           . lisinopril (ZESTRIL) 5 MG tablet 90 tablet 0    Sig: Take 1 tablet (5 mg total) by mouth daily.     Cardiovascular:  ACE Inhibitors Failed - 07/28/2019  1:21 PM      Failed - Cr in normal range and within 180 days    Creat  Date Value Ref Range Status  06/14/2016 1.08 0.60 - 1.35 mg/dL Final   Creatinine, Ser  Date Value Ref Range Status  03/22/2019 1.28 (H) 0.76 - 1.27 mg/dL Final         Failed - Valid encounter within last 6 months    Recent Outpatient Visits          4 months ago Uncontrolled type 2 diabetes mellitus without complication, without long-term current use of insulin ([place])   Primary Care at [place], MD   7 months ago Uncontrolled type 2 diabetes mellitus without complication, without long-term current use of insulin ([place])   Primary Care at [place], Arlie Solomons, MD   1  year ago Annual physical exam   [facility] at [facility], Damaris Hippo, PA-C   1 year ago Uncontrolled type 2 diabetes mellitus without complication, without long-term current use of insulin [facility])   [facility] at [facility], Damaris Hippo, PA-C   2 years ago Annual physical exam   Primary Care at [place], PA-C      Future Appointments            In 2 weeks Maximiano Coss, NP Primary Care at [place], [place] in normal range and within 180 days    Potassium  Date Value Ref Range Status  03/22/2019 4.8 3.5 - 5.2 mmol/L Final         Passed - Patient is not pregnant      Passed - Last BP in normal range    BP Readings from Last 1 Encounters:  12/18/18 110/74

## 2019-07-28 NOTE — Telephone Encounter (Signed)
Medication Refill - Medication: sildenafil (VIAGRA) 100 MG tablet [[id number]] lisinopril (PRINIVIL,ZESTRIL) 5 MG tablet  rosuvastatin (CRESTOR) 10 MG tablet    Has the patient contacted their pharmacy? Yes.    (Agent: If no, request that the patient contact the pharmacy for the refill.) (Agent: If yes, when and what did the pharmacy advise?)  Preferred [facility] (with phone [facility]  Agent: Please be advised that RX refills may take up to 3 business days. We ask that you follow-up with your pharmacy.

## 2019-08-06 ENCOUNTER — Encounter: Payer: Commercial Managed Care - PPO | Admitting: Registered Nurse

## 2019-08-13 ENCOUNTER — Other Ambulatory Visit: Payer: Self-pay

## 2019-08-13 ENCOUNTER — Ambulatory Visit (INDEPENDENT_AMBULATORY_CARE_PROVIDER_SITE_OTHER): Payer: Commercial Managed Care - PPO | Admitting: Registered Nurse

## 2019-08-13 ENCOUNTER — Other Ambulatory Visit (HOSPITAL_COMMUNITY)
Admission: RE | Admit: 2019-08-13 | Discharge: 2019-08-13 | Disposition: A | Discharge status: Home / Self Care | Payer: Commercial Managed Care - PPO | Source: Ambulatory Visit | Attending: Family Medicine | Admitting: Family Medicine

## 2019-08-13 ENCOUNTER — Encounter: Payer: Self-pay | Admitting: Registered Nurse

## 2019-08-13 VITALS — BP 112/75 | HR 77 | Temp 98.1°F | Resp 16 | Ht 74.8 in | Wt 312.0 lb

## 2019-08-13 DIAGNOSIS — Z113 Encounter for screening for infections with a predominantly sexual mode of transmission: Secondary | ICD-10-CM | POA: Insufficient documentation

## 2019-08-13 DIAGNOSIS — Z13220 Encounter for screening for lipoid disorders: Secondary | ICD-10-CM

## 2019-08-13 DIAGNOSIS — Z0001 Encounter for general adult medical examination with abnormal findings: Secondary | ICD-10-CM

## 2019-08-13 DIAGNOSIS — E7800 Pure hypercholesterolemia, unspecified: Secondary | ICD-10-CM

## 2019-08-13 DIAGNOSIS — E1165 Type 2 diabetes mellitus with hyperglycemia: Secondary | ICD-10-CM | POA: Diagnosis not present

## 2019-08-13 DIAGNOSIS — IMO0001 Reserved for inherently not codable concepts without codable children: Secondary | ICD-10-CM

## 2019-08-13 DIAGNOSIS — Z0000 Encounter for general adult medical examination without abnormal findings: Secondary | ICD-10-CM

## 2019-08-13 DIAGNOSIS — N529 Male erectile dysfunction, unspecified: Secondary | ICD-10-CM

## 2019-08-13 MED ORDER — SILDENAFIL CITRATE 100 MG PO TABS: 100.0000 mg | ORAL_TABLET | Freq: Every day | ORAL | 3 refills | Status: DC | PRN

## 2019-08-13 MED ORDER — LISINOPRIL 5 MG PO TABS: 5.0000 mg | ORAL_TABLET | Freq: Every day | ORAL | 0 refills | Status: DC

## 2019-08-13 MED ORDER — ROSUVASTATIN CALCIUM 10 MG PO TABS: 10.0000 mg | ORAL_TABLET | Freq: Every day | ORAL | 0 refills | Status: DC

## 2019-08-13 MED ORDER — JARDIANCE 25 MG PO TABS: 25.0000 mg | ORAL_TABLET | Freq: Every day | ORAL | 1 refills | Status: DC

## 2019-08-13 MED ORDER — METFORMIN HCL ER 500 MG PO TB24: 1000.0000 mg | ORAL_TABLET | Freq: Two times a day (BID) | ORAL | 0 refills | Status: DC

## 2019-08-13 MED ORDER — TADALAFIL 10 MG PO TABS: ORAL_TABLET | ORAL | 3 refills | Status: DC

## 2019-08-13 NOTE — Progress Notes (Signed)
Established Patient Office Visit  Subjective:  Patient ID: Calvin Huber, male    DOB: 1991-05-25  Age: 28 y.o. MRN: [id number]  CC:  Chief Complaint  Patient presents with  . Annual Exam    HPI NAZIAH URDA presents for CPE, med check, and labs  Has history significant for T2DM without complication. Last A1c >11%. We are hoping to bring this down, ideally goal below 7%. He is aware of diet and lifestyle modifications, he is taking metformin and jardiance reliably.  We will check A1c, TSH, CBC, CMP, Lipid Panel, and Std screen today. Denies symptoms. 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 Allergy   . Diabetes mellitus without complication [facility])     Past Surgical History:  Procedure Laterality Date  . KNEE SURGERY      Family History  Problem Relation Age of Onset  . Diabetes Mother   . Diabetes Sister     Social History   Socioeconomic History  . Marital status: Single    Spouse name: Not on file  . Number of children: Not on file  . Years of education: Not on file  . Highest education level: Not on file  Occupational History  . Occupation: Emergency planning/management officer  Social Needs  . Financial resource strain: Not on file  . Food insecurity    Worry: Not on file    Inability: Not on file  . Transportation needs    Medical: Not on file    Non-medical: Not on file  Tobacco Use  . Smoking status: Never Smoker  . Smokeless tobacco: Never Used  Substance and Sexual Activity  . Alcohol use: No  . Drug use: No  . Sexual activity: Yes    Partners: Female  Lifestyle  . Physical activity    Days per week: Not on file    Minutes per session: Not on file  . Stress: Not on file  Relationships  . Social Musician on phone: Not on file    Gets together: Not on file    Attends religious service: Not on file    Active member of club  or organization: Not on file    Attends meetings of clubs or organizations: Not on file    Relationship status: Not on file  . Intimate partner violence    Fear of current or ex partner: Not on file    Emotionally abused: Not on file    Physically abused: Not on file    Forced sexual activity: Not on file  Other Topics Concern  . 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 cyclobenzaprine (FLEXERIL) 10 MG tablet Take 1 tablet (10 mg total) by mouth 2 (two) times daily as needed for muscle spasms (an neck pain/stiffness). 20 tablet 0  . glucose blood test strip Use as instructed 100 each 12  . ibuprofen (ADVIL) 800 MG tablet TK 1 T PO TID    . Insulin Pen Needle 31G X 5 MM MISC 1 each by Does not apply route 2 (two) times daily before a meal. 50 each 1  . meloxicam (MOBIC) 15 MG tablet Take 1 tablet (15 mg total) by mouth daily. 30 tablet 3  .  triamcinolone cream (KENALOG) 0.5 % Apply 1 application topically 2 (two) times daily. 45 g 0  . empagliflozin (JARDIANCE) 25 MG TABS tablet Take 25 mg by mouth daily. 90 tablet 1  . lisinopril (ZESTRIL) 5 MG tablet Take 1 tablet (5 mg total) by mouth daily. 90 tablet 0  . metFORMIN (GLUCOPHAGE XR) 500 MG 24 hr tablet Take 2 tablets (1,000 mg total) by mouth 2 (two) times daily. 360 tablet 1  . rosuvastatin (CRESTOR) 10 MG tablet Take 1 tablet (10 mg total) by mouth daily. 90 tablet 0  . sildenafil (VIAGRA) 100 MG tablet Take 1 tablet (100 mg total) by mouth daily as needed for erectile dysfunction. 10 tablet 0  . tadalafil (CIALIS) 10 MG tablet Use 0.5 tablet to 1 tablet by mouth every other day as needed for erection 10 tablet 6   No facility-administered medications prior to visit.     No Known Allergies  ROS Review of Systems  Constitutional: Negative.   HENT: Negative.   Eyes: Negative.   Respiratory: Negative.    Cardiovascular: Negative.   Gastrointestinal: Negative.   Endocrine: Negative.   Genitourinary: Negative.   Musculoskeletal: Negative.   Skin: Negative.   Allergic/Immunologic: Negative.   Neurological: Negative.   Hematological: Negative.   Psychiatric/Behavioral: Negative.   All other systems reviewed and are negative.     Objective:    Physical Exam  Constitutional: He is oriented to person, place, and time. He appears well-developed and well-nourished. No distress.  HENT:  Head: Normocephalic and atraumatic.  Right Ear: External ear normal.  Left Ear: External ear normal.  Nose: Nose normal.  Mouth/Throat: Oropharynx is clear and moist. No oropharyngeal exudate.  Eyes: Pupils are equal, round, and reactive to light. Conjunctivae and EOM are normal. Right eye exhibits no discharge. Left eye exhibits no discharge. No scleral icterus.  Neck: Normal range of motion. Neck supple. No tracheal deviation present. No thyromegaly present.  Cardiovascular: Normal rate, regular rhythm, normal heart sounds and intact distal pulses. Exam reveals no gallop and no friction rub.  No murmur heard. Pulmonary/Chest: Effort normal and breath sounds normal. No respiratory distress. He has no wheezes. He has no rales. He exhibits no tenderness.  Abdominal: Soft. Bowel sounds are normal. He exhibits no distension and no mass. There is no abdominal tenderness. There is no rebound and no guarding.  Musculoskeletal: Normal range of motion.        General: No tenderness, deformity or edema.  Lymphadenopathy:    He has no cervical adenopathy.  Neurological: He is alert and oriented to person, place, and time. He has normal reflexes. No cranial nerve deficit. He exhibits normal muscle tone. Coordination normal.  Skin: Skin is warm and dry. No rash noted. He is not diaphoretic. No erythema. No pallor.  Psychiatric: He has a normal mood and affect. His behavior is normal. Judgment and thought content  normal.  Nursing note and vitals reviewed.   BP 112/75   Pulse 77   Temp 98.1 F (36.7 C) (Oral)   Resp 16   Ht 6' 2.8" (1.9 m)   Wt (!) 312 lb (141.5 kg)   SpO2 100%   BMI 39.20 kg/m  Wt Readings from Last 3 Encounters:  08/13/19 (!) 312 lb (141.5 kg)  12/18/18 (!) 321 lb 6.4 oz (145.8 kg)  05/29/18 (!) 307 lb 6.4 oz (139.4 kg)     There are no preventive care reminders to display for this patient.  There are no  preventive care reminders to display for this patient.  Lab Results  Component Value Date   TSH 1.880 05/29/2018   Lab Results  Component Value Date   WBC 6.9 05/29/2018   HGB 15.2 05/29/2018   HCT 44.7 05/29/2018   MCV 81 05/29/2018   PLT 291 05/29/2018   Lab Results  Component Value Date   NA 135 03/22/2019   K 4.8 03/22/2019   CO2 16 (L) 03/22/2019   GLUCOSE 451 (H) 03/22/2019   BUN 18 03/22/2019   CREATININE 1.28 (H) 03/22/2019   BILITOT 0.3 03/22/2019   ALKPHOS 79 03/22/2019   AST 19 03/22/2019   ALT 23 03/22/2019   PROT 8.2 03/22/2019   ALBUMIN 4.7 03/22/2019   CALCIUM 9.7 03/22/2019   ANIONGAP 11 06/14/2016   Lab Results  Component Value Date   CHOL 168 03/22/2019   Lab Results  Component Value Date   HDL 31 (L) 03/22/2019   Lab Results  Component Value Date   LDLCALC Comment 03/22/2019   Lab Results  Component Value Date   TRIG 709 (HH) 03/22/2019   Lab Results  Component Value Date   CHOLHDL 5.4 (H) 03/22/2019   Lab Results  Component Value Date   HGBA1C 11.2 (H) 03/22/2019      Assessment & Plan:   Problem List Items Addressed This Visit      Other   Erectile dysfunction   Relevant Medications   sildenafil (VIAGRA) 100 MG tablet   tadalafil (CIALIS) 10 MG tablet    Other Visit Diagnoses    Uncontrolled type 2 diabetes mellitus without complication, without long-term current use of insulin (HCC)    -  Primary   Relevant Medications   empagliflozin (JARDIANCE) 25 MG TABS tablet   lisinopril (ZESTRIL) 5 MG  tablet   metFORMIN (GLUCOPHAGE XR) 500 MG 24 hr tablet   rosuvastatin (CRESTOR) 10 MG tablet   Other Relevant Orders   CBC with Differential/Platelet   Comprehensive metabolic panel   Hemoglobin A1c   Screen for STD (sexually transmitted disease)       Relevant Orders   RPR   HIV Antibody (routine testing w rflx)   GC/Chlamydia probe amp ([place])not at [facility]   Lipid screening       Relevant Orders   Lipid panel   Routine general medical examination at a health care facility       Elevated LDL cholesterol level       Relevant Medications   rosuvastatin (CRESTOR) 10 MG tablet      Meds ordered this encounter  Medications  . empagliflozin (JARDIANCE) 25 MG TABS tablet    Sig: Take 25 mg by mouth daily.    Dispense:  90 tablet    Refill:  1    Order Specific Question:   Supervising Provider    Answer:   Collie Siad A [id number]  . lisinopril (ZESTRIL) 5 MG tablet    Sig: Take 1 tablet (5 mg total) by mouth daily.    Dispense:  90 tablet    Refill:  0    Order Specific Question:   Supervising Provider    Answer:   Collie Siad A [id number]  . metFORMIN (GLUCOPHAGE XR) 500 MG 24 hr tablet    Sig: Take 2 tablets (1,000 mg total) by mouth 2 (two) times daily.    Dispense:  360 tablet    Refill:  0    Order Specific Question:   Supervising Provider  AnswerCollie Siad A [id number]  . rosuvastatin (CRESTOR) 10 MG tablet    Sig: Take 1 tablet (10 mg total) by mouth daily.    Dispense:  90 tablet    Refill:  0    Order Specific Question:   Supervising Provider    Answer:   Collie Siad A [id number]  . sildenafil (VIAGRA) 100 MG tablet    Sig: Take 1 tablet (100 mg total) by mouth daily as needed for erectile dysfunction.    Dispense:  30 tablet    Refill:  3    Please dispense 10 during the coronavirus season to decrease frequency of visits to the pharmacy    Order Specific Question:   Supervising Provider    Answer:   Doristine Bosworth [[id number]]  . tadalafil  (CIALIS) 10 MG tablet    Sig: Use 0.5 tablet to 1 tablet by mouth every other day as needed for erection    Dispense:  30 tablet    Refill:  3    Please dispense 10 during the coronavirus season to decrease frequency of visits to the pharmacy    Order Specific Question:   Supervising Provider    Answer:   Doristine Bosworth [[id number]]    Follow-up: Return in 3 months (on 11/13/2019) for A1c, med check.   PLAN  Refilled all medications for three months  Will return to see myself or PCP Dr. Creta Levin at that time for A1c and med check  Normal findings on CPE.  Patient encouraged to call clinic with any questions, comments, or concerns.    Janeece Agee, NP

## 2019-08-13 NOTE — Patient Instructions (Addendum)
   If you have lab work done today you will be contacted with your lab results within the next 2 weeks.  If you have not heard from us then please contact us. The fastest way to get your results is to register for My Chart.   IF you received an x-ray today, you will receive an invoice from [facility]. Please contact [facility] at [phone] with questions or concerns regarding your invoice.   IF you received labwork today, you will receive an invoice from [facility]. Please contact [facility] at [phone] with questions or concerns regarding your invoice.   Our billing staff will not be able to assist you with questions regarding bills from these companies.  You will be contacted with the lab results as soon as they are available. The fastest way to get your results is to activate your My Chart account. Instructions are located on the last page of this paperwork. If you have not heard from us regarding the results in 2 weeks, please contact this office.       Health Maintenance, Male Adopting a healthy lifestyle and getting preventive care are important in promoting health and wellness. Ask your health care provider about:  The right schedule for you to have regular tests and exams.  Things you can do on your own to prevent diseases and keep yourself healthy. What should I know about diet, weight, and exercise? Eat a healthy diet   Eat a diet that includes plenty of vegetables, fruits, low-fat dairy products, and lean protein.  Do not eat a lot of foods that are high in solid fats, added sugars, or sodium. Maintain a healthy weight Body mass index (BMI) is a measurement that can be used to identify possible weight problems. It estimates body fat based on height and weight. Your health care provider can help determine your BMI and help you achieve or maintain a healthy weight. Get regular exercise Get regular exercise. This is one of the most important things  you can do for your health. Most adults should:  Exercise for at least 150 minutes each week. The exercise should increase your heart rate and make you sweat (moderate-intensity exercise).  Do strengthening exercises at least twice a week. This is in addition to the moderate-intensity exercise.  Spend less time sitting. Even light physical activity can be beneficial. Watch cholesterol and blood lipids Have your blood tested for lipids and cholesterol at 30 years of age, then have this test every 5 years. You may need to have your cholesterol levels checked more often if:  Your lipid or cholesterol levels are high.  You are older than 30 years of age.  You are at high risk for heart disease. What should I know about cancer screening? Many types of cancers can be detected early and may often be prevented. Depending on your health history and family history, you may need to have cancer screening at various ages. This may include screening for:  Colorectal cancer.  Prostate cancer.  Skin cancer.  Lung cancer. What should I know about heart disease, diabetes, and high blood pressure? Blood pressure and heart disease  High blood pressure causes heart disease and increases the risk of stroke. This is more likely to develop in people who have high blood pressure readings, are of African descent, or are overweight.  Talk with your health care provider about your target blood pressure readings.  Have your blood pressure checked: ? Every 3-5 years if you are   18-39 years of age. ? Every year if you are 40 years old or older.  If you are between the ages of 65 and 75 and are a current or former smoker, ask your health care provider if you should have a one-time screening for abdominal aortic aneurysm (AAA). Diabetes Have regular diabetes screenings. This checks your fasting blood sugar level. Have the screening done:  Once every three years after age 45 if you are at a normal weight and  have a low risk for diabetes.  More often and at a younger age if you are overweight or have a high risk for diabetes. What should I know about preventing infection? Hepatitis B If you have a higher risk for hepatitis B, you should be screened for this virus. Talk with your health care provider to find out if you are at risk for hepatitis B infection. Hepatitis C Blood testing is recommended for:  Everyone born from 1945 through 1965.  Anyone with known risk factors for hepatitis C. Sexually transmitted infections (STIs)  You should be screened each year for STIs, including gonorrhea and chlamydia, if: ? You are sexually active and are younger than 30 years of age. ? You are older than 30 years of age and your health care provider tells you that you are at risk for this type of infection. ? Your sexual activity has changed since you were last screened, and you are at increased risk for chlamydia or gonorrhea. Ask your health care provider if you are at risk.  Ask your health care provider about whether you are at high risk for HIV. Your health care provider may recommend a prescription medicine to help prevent HIV infection. If you choose to take medicine to prevent HIV, you should first get tested for HIV. You should then be tested every 3 months for as long as you are taking the medicine. Follow these instructions at home: Lifestyle  Do not use any products that contain nicotine or tobacco, such as cigarettes, e-cigarettes, and chewing tobacco. If you need help quitting, ask your health care provider.  Do not use street drugs.  Do not share needles.  Ask your health care provider for help if you need support or information about quitting drugs. Alcohol use  Do not drink alcohol if your health care provider tells you not to drink.  If you drink alcohol: ? Limit how much you have to 0-2 drinks a day. ? Be aware of how much alcohol is in your drink. In the [place] one drink equals  one 12 oz bottle of beer (355 mL), one 5 oz glass of wine (148 mL), or one 1 oz glass of hard liquor (44 mL). General instructions  Schedule regular health, dental, and eye exams.  Stay current with your vaccines.  Tell your health care provider if: ? You often feel depressed. ? You have ever been abused or do not feel safe at home. Summary  Adopting a healthy lifestyle and getting preventive care are important in promoting health and wellness.  Follow your health care provider's instructions about healthy diet, exercising, and getting tested or screened for diseases.  Follow your health care provider's instructions on monitoring your cholesterol and blood pressure. This information is not intended to replace advice given to you by your health care provider. Make sure you discuss any questions you have with your health care provider. Document Released: 06/06/2008 Document Revised: 12/02/2018 Document Reviewed: 12/02/2018 Elsevier Patient Education  2020 [employer] Inc.       Why follow it? Research shows. . Those who follow the Mediterranean diet have a reduced risk of heart disease  . The diet is associated with a reduced incidence of Parkinson's and Alzheimer's diseases . People following the diet may have longer life expectancies and lower rates of chronic diseases  . The Dietary Guidelines for Americans recommends the Mediterranean diet as an eating plan to promote health and prevent disease  What Is the Mediterranean Diet?  . Healthy eating plan based on typical foods and recipes of Mediterranean-style cooking . The diet is primarily a plant based diet; these foods should make up a majority of meals   Starches - Plant based foods should make up a majority of meals - They are an important sources of vitamins, minerals, energy, antioxidants, and fiber - Choose whole grains, foods high in fiber and minimally processed items  - Typical grain sources include wheat, oats, barley, corn,  brown rice, bulgar, farro, millet, polenta, couscous  - Various types of beans include chickpeas, lentils, fava beans, black beans, white beans   Fruits  Veggies - Large quantities of antioxidant rich fruits & veggies; 6 or more servings  - Vegetables can be eaten raw or lightly drizzled with oil and cooked  - Vegetables common to the traditional Mediterranean Diet include: artichokes, arugula, beets, broccoli, brussel sprouts, cabbage, carrots, celery, collard greens, cucumbers, eggplant, kale, leeks, lemons, lettuce, mushrooms, okra, onions, peas, peppers, potatoes, pumpkin, radishes, rutabaga, shallots, spinach, sweet potatoes, turnips, zucchini - Fruits common to the Mediterranean Diet include: apples, apricots, avocados, cherries, clementines, dates, figs, grapefruits, grapes, melons, nectarines, oranges, peaches, pears, pomegranates, strawberries, tangerines  Fats - Replace butter and margarine with healthy oils, such as olive oil, canola oil, and tahini  - Limit nuts to no more than a handful a day  - Nuts include walnuts, almonds, pecans, pistachios, pine nuts  - Limit or avoid candied, honey roasted or heavily salted nuts - Olives are central to the Mediterranean diet - can be eaten whole or used in a variety of dishes   Meats Protein - Limiting red meat: no more than a few times a month - When eating red meat: choose lean cuts and keep the portion to the size of deck of cards - Eggs: approx. 0 to 4 times a week  - Fish and lean poultry: at least 2 a week  - Healthy protein sources include, chicken, turkey, lean beef, lamb - Increase intake of seafood such as tuna, salmon, trout, mackerel, shrimp, scallops - Avoid or limit high fat processed meats such as sausage and bacon  Dairy - Include moderate amounts of low fat dairy products  - Focus on healthy dairy such as fat free yogurt, skim milk, low or reduced fat cheese - Limit dairy products higher in fat such as whole or 2% milk, cheese,  ice cream  Alcohol - Moderate amounts of red wine is ok  - No more than 5 oz daily for women (all ages) and men older than age 65  - No more than 10 oz of wine daily for men younger than 65  Other - Limit sweets and other desserts  - Use herbs and spices instead of salt to flavor foods  - Herbs and spices common to the traditional Mediterranean Diet include: basil, bay leaves, chives, cloves, cumin, fennel, garlic, lavender, marjoram, mint, oregano, parsley, pepper, rosemary, sage, savory, sumac, tarragon, thyme   It's not just a diet, it's a lifestyle:  . The Mediterranean diet   includes lifestyle factors typical of those in the region  . Foods, drinks and meals are best eaten with others and savored . Daily physical activity is important for overall good health . This could be strenuous exercise like running and aerobics . This could also be more leisurely activities such as walking, housework, yard-work, or taking the stairs . Moderation is the key; a balanced and healthy diet accommodates most foods and drinks . Consider portion sizes and frequency of consumption of certain foods   Meal Ideas & Options:  . Breakfast:  o Whole wheat toast or whole wheat English muffins with peanut butter & hard boiled egg o Steel cut oats topped with apples & cinnamon and skim milk  o Fresh fruit: banana, strawberries, melon, berries, peaches  o Smoothies: strawberries, bananas, greek yogurt, peanut butter o Low fat greek yogurt with blueberries and granola  o Egg white omelet with spinach and mushrooms o Breakfast couscous: whole wheat couscous, apricots, skim milk, cranberries  . Sandwiches:  o Hummus and grilled vegetables (peppers, zucchini, squash) on whole wheat bread   o Grilled chicken on whole wheat pita with lettuce, tomatoes, cucumbers or tzatziki  o Tuna salad on whole wheat bread: tuna salad made with greek yogurt, olives, red peppers, capers, green onions o Garlic rosemary lamb pita:  lamb sauted with garlic, rosemary, salt & pepper; add lettuce, cucumber, greek yogurt to pita - flavor with lemon juice and black pepper  . Seafood:  o Mediterranean grilled salmon, seasoned with garlic, basil, parsley, lemon juice and black pepper o Shrimp, lemon, and spinach whole-grain pasta salad made with low fat greek yogurt  o Seared scallops with lemon orzo  o Seared tuna steaks seasoned salt, pepper, coriander topped with tomato mixture of olives, tomatoes, olive oil, minced garlic, parsley, green onions and cappers  . Meats:  o Herbed greek chicken salad with kalamata olives, cucumber, feta  o Red bell peppers stuffed with spinach, bulgur, lean ground beef (or lentils) & topped with feta   o Kebabs: skewers of chicken, tomatoes, onions, zucchini, squash  o Turkey burgers: made with red onions, mint, dill, lemon juice, feta cheese topped with roasted red peppers . Vegetarian o Cucumber salad: cucumbers, artichoke hearts, celery, red onion, feta cheese, tossed in olive oil & lemon juice  o Hummus and whole grain pita points with a greek salad (lettuce, tomato, feta, olives, cucumbers, red onion) o Lentil soup with celery, carrots made with vegetable broth, garlic, salt and pepper  o Tabouli salad: parsley, bulgur, mint, scallions, cucumbers, tomato, radishes, lemon juice, olive oil, salt and pepper.       Fat and Cholesterol Restricted Eating Plan Eating a diet that limits fat and cholesterol may help lower your risk for heart disease and other conditions. Your body needs fat and cholesterol for basic functions, but eating too much of these things can be harmful to your health. Your health care provider may order lab tests to check your blood fat (lipid) and cholesterol levels. This helps your health care provider understand your risk for certain conditions and whether you need to make diet changes. Work with your health care provider or dietitian to make an eating plan that is right for  you. Your plan includes:  Limit your fat intake to ______% or less of your total calories a day.  Limit your saturated fat intake to ______% or less of your total calories a day.  Limit the amount of cholesterol in your diet to less   than _________mg a day.  Eat ___________ g of fiber a day. What are tips for following this plan? General guidelines   If you are overweight, work with your health care provider to lose weight safely. Losing just 5-10% of your body weight can improve your overall health and help prevent diseases such as diabetes and heart disease.  Avoid: ? Foods with added sugar. ? Fried foods. ? Foods that contain partially hydrogenated oils, including stick margarine, some tub margarines, cookies, crackers, and other baked goods.  Limit alcohol intake to no more than 1 drink a day for nonpregnant women and 2 drinks a day for men. One drink equals 12 oz of beer, 5 oz of wine, or 1 oz of hard liquor. Reading food labels  Check food labels for: ? Trans fats, partially hydrogenated oils, or high amounts of saturated fat. Avoid foods that contain saturated fat and trans fat. ? The amount of cholesterol in each serving. Try to eat no more than 200 mg of cholesterol each day. ? The amount of fiber in each serving. Try to eat at least 20-30 g of fiber each day.  Choose foods with healthy fats, such as: ? Monounsaturated and polyunsaturated fats. These include olive and canola oil, flaxseeds, walnuts, almonds, and seeds. ? Omega-3 fats. These are found in foods such as salmon, mackerel, sardines, tuna, flaxseed oil, and ground flaxseeds.  Choose grain products that have whole grains. Look for the word "whole" as the first word in the ingredient list. Cooking  Cook foods using methods other than frying. Baking, boiling, grilling, and broiling are some healthy options.  Eat more home-cooked food and less restaurant, buffet, and fast food.  Avoid cooking using saturated  fats. ? Animal sources of saturated fats include meats, butter, and cream. ? Plant sources of saturated fats include palm oil, palm kernel oil, and coconut oil. Meal planning   At meals, imagine dividing your plate into fourths: ? Fill one-half of your plate with vegetables and green salads. ? Fill one-fourth of your plate with whole grains. ? Fill one-fourth of your plate with lean protein foods.  Eat fish that is high in omega-3 fats at least two times a week.  Eat more foods that contain fiber, such as whole grains, beans, apples, broccoli, carrots, peas, and barley. These foods help promote healthy cholesterol levels in the blood. Recommended foods Grains  Whole grains, such as whole wheat or whole grain breads, crackers, cereals, and pasta. Unsweetened oatmeal, bulgur, barley, quinoa, or brown rice. Corn or whole wheat flour tortillas. Vegetables  Fresh or frozen vegetables (raw, steamed, roasted, or grilled). Green salads. Fruits  All fresh, canned (in natural juice), or frozen fruits. Meats and other protein foods  Ground beef (85% or leaner), grass-fed beef, or beef trimmed of fat. Skinless chicken or turkey. Ground chicken or turkey. Pork trimmed of fat. All fish and seafood. Egg whites. Dried beans, peas, or lentils. Unsalted nuts or seeds. Unsalted canned beans. Natural nut butters without added sugar and oil. Dairy  Low-fat or nonfat dairy products, such as skim or 1% milk, 2% or reduced-fat cheeses, low-fat and fat-free ricotta or cottage cheese, or plain low-fat and nonfat yogurt. Fats and oils  Tub margarine without trans fats. Light or reduced-fat mayonnaise and salad dressings. Avocado. Olive, canola, sesame, or safflower oils. The items listed above may not be a complete list of recommended foods or beverages. Contact your dietitian for more options. Foods to avoid Grains  White bread.   White pasta. White rice. Cornbread. Bagels, pastries, and croissants.  Crackers and snack foods that contain trans fat and hydrogenated oils. Vegetables  Vegetables cooked in cheese, cream, or butter sauce. Fried vegetables. Fruits  Canned fruit in heavy syrup. Fruit in cream or butter sauce. Fried fruit. Meats and other protein foods  Fatty cuts of meat. Ribs, chicken wings, bacon, sausage, bologna, salami, chitterlings, fatback, hot dogs, bratwurst, and packaged lunch meats. Liver and organ meats. Whole eggs and egg yolks. Chicken and turkey with skin. Fried meat. Dairy  Whole or 2% milk, cream, half-and-half, and cream cheese. Whole milk cheeses. Whole-fat or sweetened yogurt. Full-fat cheeses. Nondairy creamers and whipped toppings. Processed cheese, cheese spreads, and cheese curds. Beverages  Alcohol. Sugar-sweetened drinks such as sodas, lemonade, and fruit drinks. Fats and oils  Butter, stick margarine, lard, shortening, ghee, or bacon fat. Coconut, palm kernel, and palm oils. Sweets and desserts  Corn syrup, sugars, honey, and molasses. Candy. Jam and jelly. Syrup. Sweetened cereals. Cookies, pies, cakes, donuts, muffins, and ice cream. The items listed above may not be a complete list of foods and beverages to avoid. Contact your dietitian for more information. Summary  Your body needs fat and cholesterol for basic functions. However, eating too much of these things can be harmful to your health.  Work with your health care provider and dietitian to follow a diet low in fat and cholesterol. Doing this may help lower your risk for heart disease and other conditions.  Choose healthy fats, such as monounsaturated and polyunsaturated fats, and foods high in omega-3 fatty acids.  Eat fiber-rich foods, such as whole grains, beans, peas, fruits, and vegetables.  Limit or avoid alcohol, fried foods, and foods high in saturated fats, partially hydrogenated oils, and sugar. This information is not intended to replace advice given to you by your health  care provider. Make sure you discuss any questions you have with your health care provider. Document Released: 12/09/2005 Document Revised: 11/21/2017 Document Reviewed: 08/26/2017 Elsevier Patient Education  2020 [employer] Inc.  American Heart Association (AHA) Exercise Recommendation  Being physically active is important to prevent heart disease and stroke, the nation's No. 1and No. 5killers. To improve overall cardiovascular health, we suggest at least 150 minutes per week of moderate exercise or 75 minutes per week of vigorous exercise (or a combination of moderate and vigorous activity). Thirty minutes a day, five times a week is an easy goal to remember. You will also experience benefits even if you divide your time into two or three segments of 10 to 15 minutes per day.  For people who would benefit from lowering their blood pressure or cholesterol, we recommend 40 minutes of aerobic exercise of moderate to vigorous intensity three to four times a week to lower the risk for heart attack and stroke.  Physical activity is anything that makes you move your body and burn calories.  This includes things like climbing stairs or playing sports. Aerobic exercises benefit your heart, and include walking, jogging, swimming or biking. Strength and stretching exercises are best for overall stamina and flexibility.  The simplest, positive change you can make to effectively improve your heart health is to start walking. It's enjoyable, free, easy, social and great exercise. A walking program is flexible and boasts high success rates because people can stick with it. It's easy for walking to become a regular and satisfying part of life.   For Overall Cardiovascular Health:  At least 30 minutes of moderate-intensity aerobic activity   at least 5 days per week for a total of 150  OR   At least 25 minutes of vigorous aerobic activity at least 3 days per week for a total of 75 minutes; or a combination of  moderate- and vigorous-intensity aerobic activity  AND   Moderate- to high-intensity muscle-strengthening activity at least 2 days per week for additional health benefits.  For Lowering Blood Pressure and Cholesterol  An average 40 minutes of moderate- to vigorous-intensity aerobic activity 3 or 4 times per week  What if I can't make it to the time goal? Something is always better than nothing! And everyone has to start somewhere. Even if you've been sedentary for years, today is the day you can begin to make healthy changes in your life. If you don't think you'll make it for 30 or 40 minutes, set a reachable goal for today. You can work up toward your overall goal by increasing your time as you get stronger. Don't let all-or-nothing thinking rob you of doing what you can every day.  Source:http://www.heart.org    

## 2019-08-14 LAB — COMPREHENSIVE METABOLIC PANEL
ALT: 16 IU/L (ref 0–44)
AST: 12 IU/L (ref 0–40)
Albumin/Globulin Ratio: 1.7 (ref 1.2–2.2)
Albumin: 4.7 g/dL (ref 4.1–5.2)
Alkaline Phosphatase: 61 IU/L (ref 39–117)
BUN/Creatinine Ratio: 18 (ref 9–20)
BUN: 18 mg/dL (ref 6–20)
Bilirubin Total: 0.4 mg/dL (ref 0.0–1.2)
CO2: 23 mmol/L (ref 20–29)
Calcium: 9.6 mg/dL (ref 8.7–10.2)
Chloride: 98 mmol/L (ref 96–106)
Creatinine, Ser: 1.02 mg/dL (ref 0.76–1.27)
GFR calc Af Amer: 113 mL/min/{1.73_m2} (ref >59)
GFR calc non Af Amer: 98 mL/min/{1.73_m2} (ref >59)
Globulin, Total: 2.8 g/dL (ref 1.5–4.5)
Glucose: 308 mg/dL — ABNORMAL HIGH (ref 65–99)
Potassium: 4.2 mmol/L (ref 3.5–5.2)
Sodium: 136 mmol/L (ref 134–144)
Total Protein: 7.5 g/dL (ref 6.0–8.5)

## 2019-08-14 LAB — CBC WITH DIFFERENTIAL/PLATELET
Basophils Absolute: 0.1 10*3/uL (ref 0.0–0.2)
Basos: 1 %
EOS (ABSOLUTE): 0.1 10*3/uL (ref 0.0–0.4)
Eos: 1 %
Hematocrit: 46.1 % (ref 37.5–51.0)
Hemoglobin: 14.9 g/dL (ref 13.0–17.7)
Immature Grans (Abs): 0 10*3/uL (ref 0.0–0.1)
Immature Granulocytes: 0 %
Lymphocytes Absolute: 2.7 10*3/uL (ref 0.7–3.1)
Lymphs: 28 %
MCH: 27 pg (ref 26.6–33.0)
MCHC: 32.3 g/dL (ref 31.5–35.7)
MCV: 84 fL (ref 79–97)
Monocytes Absolute: 0.7 10*3/uL (ref 0.1–0.9)
Monocytes: 7 %
Neutrophils Absolute: 6.5 10*3/uL (ref 1.4–7.0)
Neutrophils: 63 %
Platelets: 260 10*3/uL (ref 150–450)
RBC: 5.51 x10E6/uL (ref 4.14–5.80)
RDW: 13 % (ref 11.6–15.4)
WBC: 10 10*3/uL (ref 3.4–10.8)

## 2019-08-14 LAB — HIV ANTIBODY (ROUTINE TESTING W REFLEX): HIV Screen 4th Generation wRfx: NONREACTIVE

## 2019-08-14 LAB — LIPID PANEL
Chol/HDL Ratio: 4.5 ratio (ref 0.0–5.0)
Cholesterol, Total: 162 mg/dL (ref 100–199)
HDL: 36 mg/dL — ABNORMAL LOW (ref >39)
LDL Calculated: 90 mg/dL (ref 0–99)
Triglycerides: 179 mg/dL — ABNORMAL HIGH (ref 0–149)
VLDL Cholesterol Cal: 36 mg/dL (ref 5–40)

## 2019-08-14 LAB — GC/CHLAMYDIA PROBE AMP (~~LOC~~) NOT AT ARMC
Chlamydia: NEGATIVE
Neisseria Gonorrhea: NEGATIVE

## 2019-08-14 LAB — HEMOGLOBIN A1C
Est. average glucose Bld gHb Est-mCnc: 312 mg/dL
Hgb A1c MFr Bld: 12.5 % — ABNORMAL HIGH (ref 4.8–5.6)

## 2019-08-14 LAB — RPR: RPR Ser Ql: NONREACTIVE

## 2019-08-17 ENCOUNTER — Other Ambulatory Visit: Payer: Self-pay | Admitting: Registered Nurse

## 2019-08-17 DIAGNOSIS — IMO0001 Reserved for inherently not codable concepts without codable children: Secondary | ICD-10-CM

## 2019-08-17 MED ORDER — DULAGLUTIDE 0.75 MG/0.5ML ~~LOC~~ SOAJ: 0.7500 mg | SUBCUTANEOUS | 0 refills | Status: DC

## 2019-08-17 NOTE — Progress Notes (Signed)
Unfortunately, A1c has risen to 12.5%, up from 7.2%  8 months prior and 11.2% 3 months prior  Has stated he is taking Metformin and Jardiance regularly Will start once weekly trulicity injection and plan for 3 mo follow up with myself or Dr. Nolon Rod, his PCP  Kathrin Ruddy, NP

## 2019-09-28 ENCOUNTER — Encounter: Payer: Commercial Managed Care - PPO | Admitting: Family Medicine

## 2019-11-01 ENCOUNTER — Ambulatory Visit: Payer: Commercial Managed Care - PPO | Admitting: Family Medicine

## 2019-11-22 ENCOUNTER — Ambulatory Visit: Payer: Commercial Managed Care - PPO | Admitting: Family Medicine

## 2019-12-13 ENCOUNTER — Encounter: Payer: Self-pay | Admitting: Family Medicine

## 2019-12-13 ENCOUNTER — Ambulatory Visit: Payer: Commercial Managed Care - PPO | Admitting: Family Medicine

## 2019-12-13 ENCOUNTER — Other Ambulatory Visit: Payer: Self-pay

## 2019-12-13 VITALS — BP 116/80 | HR 77 | Temp 98.5°F | Resp 17 | Ht 74.8 in | Wt 321.0 lb

## 2019-12-13 DIAGNOSIS — E1165 Type 2 diabetes mellitus with hyperglycemia: Secondary | ICD-10-CM

## 2019-12-13 DIAGNOSIS — Z23 Encounter for immunization: Secondary | ICD-10-CM

## 2019-12-13 LAB — POCT GLYCOSYLATED HEMOGLOBIN (HGB A1C): Hemoglobin A1C: 12.4 % — AB (ref 4.0–5.6)

## 2019-12-13 MED ORDER — TRULICITY 0.75 MG/0.5ML ~~LOC~~ SOAJ: 0.7500 mg | SUBCUTANEOUS | 0 refills | Status: DC

## 2019-12-13 NOTE — Progress Notes (Signed)
Established Patient Office Visit  Subjective:  Patient ID: Calvin Huber, male    DOB: 1991/11/01  Age: 28 y.o. MRN: [id number]  CC:  Chief Complaint  Patient presents with  . A1C 3 month f/u    HPI BURNIS TENERELLI presents for   Diabetes Mellitus: Patient presents for follow up of diabetes. Symptoms: hyperglycemia. Symptoms have gradually worsened. Patient denies hypoglycemia , increase appetite, nausea and paresthesia of the feet.  Evaluation to date has been included: hemoglobin A1C.  Home sugars: patient does not check sugars.   Patient reports that he is taking Jardiance  He stopped on the metformin since it is making him have upset stomach. Trulicity was prescribed but patient has not picked it up.    Past Medical History:  Diagnosis Date  . Allergy   . Diabetes mellitus without complication [facility])     Past Surgical History:  Procedure Laterality Date  . KNEE SURGERY      Family History  Problem Relation Age of Onset  . Diabetes Mother   . Diabetes Sister     Social History   Socioeconomic History  . Marital status: Single    Spouse name: Not on file  . Number of children: Not on file  . Years of education: Not on file  . Highest education level: Not on file  Occupational History  . Occupation: Emergency planning/management officer  Tobacco Use  . Smoking status: Never Smoker  . Smokeless tobacco: Never Used  Substance and Sexual Activity  . Alcohol use: No  . Drug use: No  . Sexual activity: Yes    Partners: Female  Other Topics Concern  . Not on file  Social History Narrative   Insurance account manager   Lives with mother      Social Determinants of Health   Financial Resource Strain:   . Difficulty of Paying Living Expenses: Not on file  Food Insecurity:   . Worried About Programme researcher, broadcasting/film/video in the Last Year: Not on file  . Ran Out of Food in the Last Year: Not on file  Transportation Needs:   . Lack of Transportation (Medical): Not on file  .  Lack of Transportation (Non-Medical): Not on file  Physical Activity:   . Days of Exercise per Week: Not on file  . Minutes of Exercise per Session: Not on file  Stress:   . Feeling of Stress : Not on file  Social Connections:   . Frequency of Communication with Friends and Family: Not on file  . Frequency of Social Gatherings with Friends and Family: Not on file  . Attends Religious Services: Not on file  . Active Member of Clubs or Organizations: Not on file  . Attends Banker Meetings: Not on file  . Marital Status: Not on file  Intimate Partner Violence:   . Fear of Current or Ex-Partner: Not on file  . Emotionally Abused: Not on file  . Physically Abused: Not on file  . Sexually Abused: Not on file    Outpatient Medications Prior to Visit  Medication Sig Dispense Refill  . empagliflozin (JARDIANCE) 25 MG TABS tablet Take 25 mg by mouth daily. 90 tablet 1  . glucose blood test strip Use as instructed 100 each 12  . ibuprofen (ADVIL) 800 MG tablet TK 1 T PO TID    . Insulin Pen Needle 31G X 5 MM MISC 1 each by Does not apply route 2 (two) times daily before a meal.  50 each 1  . lisinopril (ZESTRIL) 5 MG tablet Take 1 tablet (5 mg total) by mouth daily. 90 tablet 0  . meloxicam (MOBIC) 15 MG tablet Take 1 tablet (15 mg total) by mouth daily. 30 tablet 3  . metFORMIN (GLUCOPHAGE XR) 500 MG 24 hr tablet Take 2 tablets (1,000 mg total) by mouth 2 (two) times daily. 360 tablet 0  . rosuvastatin (CRESTOR) 10 MG tablet Take 1 tablet (10 mg total) by mouth daily. 90 tablet 0  . sildenafil (VIAGRA) 100 MG tablet Take 1 tablet (100 mg total) by mouth daily as needed for erectile dysfunction. 30 tablet 3  . tadalafil (CIALIS) 10 MG tablet Use 0.5 tablet to 1 tablet by mouth every other day as needed for erection 30 tablet 3  . cyclobenzaprine (FLEXERIL) 10 MG tablet Take 1 tablet (10 mg total) by mouth 2 (two) times daily as needed for muscle spasms (an neck pain/stiffness). 20  tablet 0  . Dulaglutide 0.75 MG/0.5ML SOPN Inject 0.75 mg into the skin once a week. (Patient not taking: Reported on 12/13/2019) 12 pen 0  . triamcinolone cream (KENALOG) 0.5 % Apply 1 application topically 2 (two) times daily. 45 g 0   No facility-administered medications prior to visit.    No Known Allergies  ROS Review of Systems Review of Systems  Constitutional: Negative for activity change, appetite change, chills and fever.  HENT: Negative for congestion, nosebleeds, trouble swallowing and voice change.   Respiratory: Negative for cough, shortness of breath and wheezing.   Gastrointestinal: Negative for diarrhea, nausea and vomiting.  Genitourinary: Negative for difficulty urinating, dysuria, flank pain and hematuria.  Musculoskeletal: Negative for back pain, joint swelling and neck pain.  Neurological: Negative for dizziness, speech difficulty, light-headedness and numbness.  See HPI. All other review of systems negative.     Objective:    Physical Exam  BP 116/80 (BP Location: Right Arm, Patient Position: Sitting, Cuff Size: Large)   Pulse 77   Temp 98.5 F (36.9 C) (Oral)   Resp 17   Ht 6' 2.8" (1.9 m)   Wt (!) 321 lb (145.6 kg)   SpO2 99%   BMI 40.34 kg/m  Wt Readings from Last 3 Encounters:  12/13/19 (!) 321 lb (145.6 kg)  08/13/19 (!) 312 lb (141.5 kg)  12/18/18 (!) 321 lb 6.4 oz (145.8 kg)   Physical Exam  Constitutional: Oriented to person, place, and time. Appears well-developed and well-nourished.  HENT:  Head: Normocephalic and atraumatic.  Eyes: Conjunctivae and EOM are normal.  Cardiovascular: Normal rate, regular rhythm, normal heart sounds and intact distal pulses.  No murmur heard. Pulmonary/Chest: Effort normal and breath sounds normal. No stridor. No respiratory distress. Has no wheezes.  Neurological: Is alert and oriented to person, place, and time.  Skin: Skin is warm. Capillary refill takes less than 2 seconds.  Psychiatric: Has a  normal mood and affect. Behavior is normal. Judgment and thought content normal.    Health Maintenance Due  Topic Date Due  . INFLUENZA VACCINE  07/24/2019  . FOOT EXAM  12/19/2019    There are no preventive care reminders to display for this patient.  Lab Results  Component Value Date   TSH 1.880 05/29/2018   Lab Results  Component Value Date   WBC 10.0 08/13/2019   HGB 14.9 08/13/2019   HCT 46.1 08/13/2019   MCV 84 08/13/2019   PLT 260 08/13/2019   Lab Results  Component Value Date   NA 136  08/13/2019   K 4.2 08/13/2019   CO2 23 08/13/2019   GLUCOSE 308 (H) 08/13/2019   BUN 18 08/13/2019   CREATININE 1.02 08/13/2019   BILITOT 0.4 08/13/2019   ALKPHOS 61 08/13/2019   AST 12 08/13/2019   ALT 16 08/13/2019   PROT 7.5 08/13/2019   ALBUMIN 4.7 08/13/2019   CALCIUM 9.6 08/13/2019   ANIONGAP 11 06/14/2016   Lab Results  Component Value Date   CHOL 162 08/13/2019   Lab Results  Component Value Date   HDL 36 (L) 08/13/2019   Lab Results  Component Value Date   LDLCALC 90 08/13/2019   Lab Results  Component Value Date   TRIG 179 (H) 08/13/2019   Lab Results  Component Value Date   CHOLHDL 4.5 08/13/2019   Lab Results  Component Value Date   HGBA1C 12.4 (A) 12/13/2019      Assessment & Plan:   Problem List Items Addressed This Visit      Endocrine   Type 2 diabetes mellitus with hyperglycemia, without long-term current use of insulin (HCC) - Primary  Deterioration due to not taking the PRESCRIBED meds. He should START Trulicity and return to clinic in six weeks.    Relevant Medications   Dulaglutide (TRULICITY) 0.75 MG/0.5ML SOPN   Other Relevant Orders   POCT glycosylated hemoglobin (Hb A1C) (Completed)    Other Visit Diagnoses    Need for prophylactic vaccination and inoculation against influenza            Meds ordered this encounter  Medications  . Dulaglutide (TRULICITY) 0.75 MG/0.5ML SOPN    Sig: Inject 0.75 mg into the skin once a  week.    Dispense:  12 pen    Refill:  0    Follow-up: Return in about 6 weeks (around 01/24/2020) for A1C recheck and diabetes follow up.    Doristine Bosworth, MD

## 2019-12-13 NOTE — Patient Instructions (Addendum)
  Take Trulicity once a week Decrease metformin once a day and increase slowly to metformin twice a day  Continue Jardiance Continue Crestor  Continue Lisinopril   Increase your exercise and continue to track you sugar.  Lab Results  Component Value Date   HGBA1C 12.4 (A) 12/13/2019    If you have lab work done today you will be contacted with your lab results within the next 2 weeks.  If you have not heard from [place] then please contact us. The fastest way to get your results is to register for My Chart.   IF you received an x-ray today, you will receive an invoice from [facility]. Please contact [facility] at [phone] with questions or concerns regarding your invoice.   IF you received labwork today, you will receive an invoice from [place]. Please contact [facility] at [phone] with questions or concerns regarding your invoice.   Our billing staff will not be able to assist you with questions regarding bills from these companies.  You will be contacted with the lab results as soon as they are available. The fastest way to get your results is to activate your My Chart account. Instructions are located on the last page of this paperwork. If you have not heard from [place] regarding the results in 2 weeks, please contact this office.

## 2020-01-23 NOTE — Progress Notes (Signed)
Established Patient Office Visit  Subjective:  Patient ID: Calvin Huber, male    DOB: 03/10/1991  Age: 29 y.o. MRN: [id number]  CC:  Chief Complaint  Patient presents with  . Diabetes    f/u     HPI Calvin Huber presents for   Diabetes Mellitus: Patient presents for follow up of diabetes.  Lab Results  Component Value Date   HGBA1C 12.4 (A) 12/13/2019   Patient is taking Trulity 0.52m weekly, Jardiance 287m metformin XR 100024mid.  His sugars 120-130s. He denies any hypoglycemia.  He denies nausea, vomiting, upset stomach or diarrhea He denies polyuria, polyphagia, polydipsia He denies neuropathy of the feet  He is taking Lisinopril 5mg77mrestor 10mg37me exam: not yet Dental exam: up to date Wt Readings from Last 3 Encounters:  01/24/20 (!) 319 lb (144.7 kg)  12/13/19 (!) 321 lb (145.6 kg)  08/13/19 (!) 312 lb (141.5 kg)    He went on a fast with his church. Today is the last day.  Depression screen PHQ [facility]2/12/2019 12/13/2019 08/13/2019 12/18/2018 05/29/2018  Decreased Interest 0 0 0 0 0  Down, Depressed, Hopeless 0 0 0 0 0  PHQ - 2 Score 0 0 0 0 0     Erectile Dysfunction  - patient reports that he has better results with cialis than viagra He would like a refill He states that he wants to try natural herbs Read about Ginsing, ginko, garlic, garlic, green tea He reports that he is exercising 2 times a week   Morbid obesity Wt Readings from Last 3 Encounters:  01/24/20 (!) 319 lb (144.7 kg)  12/13/19 (!) 321 lb (145.6 kg)  08/13/19 (!) 312 lb (141.5 kg)   Body mass index is 40.96 kg/m.  Patient states that he did a fast with his church He is exercising twice a week  He is trying to lose weight  He is changing how he eats  Dyslipidemia- pt plans on starting garlic supplement He hopes to increase his exercise to more than twice a week He denies any chest pains, palpitations or calf pains He takes his Crestor 10mg 67mb Results    Component Value Date   CHOL 162 08/13/2019   CHOL 168 03/22/2019   CHOL 170 12/18/2018   Lab Results  Component Value Date   HDL 36 (L) 08/13/2019   HDL 31 (L) 03/22/2019   HDL 42 12/18/2018   Lab Results  Component Value Date   LDLCALC 90 08/13/2019   LDLCALBallicont 03/22/2019   LDLCALC 106 (H) 12/18/2018   Lab Results  Component Value Date   TRIG 179 (H) 08/13/2019   TRIG 709 (HH) 03/22/2019   TRIG 109 12/18/2018   Lab Results  Component Value Date   CHOLHDL 4.5 08/13/2019   CHOLHDL 5.4 (H) 03/22/2019   CHOLHDL 4.0 12/18/2018   No results found for: LDLDIRECT   Past Medical History:  Diagnosis Date  . Allergy   [facility]:  Procedure Laterality Date  . KNEE SURGERY      Family History  Problem Relation Age of Onset  . Diabetes Mother   . Diabetes Sister     Social History   Socioeconomic History  . Marital status: Single    Spouse name: Not on file  . Number of children: Not on file  . Years of education: Not on file  . Highest education level:  Not on file  Occupational History  . Occupation: Government social research officer  Tobacco Use  . Smoking status: Never Smoker  . Smokeless tobacco: Never Used  Substance and Sexual Activity  . Alcohol use: No  . Drug use: No  . Sexual activity: Yes    Partners: Female  Other Topics Concern  . 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 Difficulty of Paying Living Expenses: Not on file  Food Insecurity:   . Worried About Charity fundraiser in the Last Year: Not on file  . Ran Out of Food in the Last Year: Not on file  Transportation Needs:   . Lack of Transportation (Medical): Not on file  . Lack of Transportation (Non-Medical): Not on file  Physical Activity:   . Days of Exercise per Week: Not on file  . Minutes of Exercise per  Session: Not on file  Stress:   . Feeling of Stress : Not on file  Social Connections:   . Frequency of Communication with Friends and Family: Not on file  . Frequency of Social Gatherings with Friends and Family: Not on file  . Attends Religious Services: Not on file  . Active Member of Clubs or Organizations: Not on file  . Attends Archivist Meetings: Not on file  . Marital Status: Not on file  Intimate Partner Violence:   . Fear of Current or Ex-Partner: Not on file  . Emotionally Abused: Not on file  . Physically Abused: Not on file  . Sexually Abused: Not on file    Outpatient Medications Prior to Visit  Medication Sig Dispense Refill  . Dulaglutide (TRULICITY) 3.47 QQ/5.9DG SOPN Inject 0.75 mg into the skin once a week. 12 pen 0  . glucose blood test strip Use as instructed 100 each 12  . ibuprofen (ADVIL) 800 MG tablet TK 1 T PO TID    . Insulin Pen Needle 31G X 5 MM MISC 1 each by Does not apply route 2 (two) times daily before a meal. 50 each 1  . sildenafil (VIAGRA) 100 MG tablet Take 1 tablet (100 mg total) by mouth daily as needed for erectile dysfunction. 30 tablet 3  . empagliflozin (JARDIANCE) 25 MG TABS tablet Take 25 mg by mouth daily. 90 tablet 1  . lisinopril (ZESTRIL) 5 MG tablet Take 1 tablet (5 mg total) by mouth daily. 90 tablet 0  . meloxicam (MOBIC) 15 MG tablet Take 1 tablet (15 mg total) by mouth daily. 30 tablet 3  . metFORMIN (GLUCOPHAGE XR) 500 MG 24 hr tablet Take 2 tablets (1,000 mg total) by mouth 2 (two) times daily. 360 tablet 0  . rosuvastatin (CRESTOR) 10 MG tablet Take 1 tablet (10 mg total) by mouth daily. 90 tablet 0  . tadalafil (CIALIS) 10 MG tablet Use 0.5 tablet to 1 tablet by mouth every other day as needed for erection 30 tablet 3   No facility-administered medications prior to visit.    No Known Allergies  ROS Review of Systems Review of Systems  Constitutional: Negative for activity change, appetite change, chills and  fever.  HENT: Negative for congestion, nosebleeds, trouble swallowing and voice change.   Respiratory: Negative for cough, shortness of breath and wheezing.   Gastrointestinal: Negative for diarrhea, nausea and vomiting.  Genitourinary: Negative for difficulty urinating, dysuria, flank pain and hematuria.  Musculoskeletal: Negative for back pain, joint swelling and neck pain.  Neurological: Negative for dizziness, speech difficulty, light-headedness and numbness.  See HPI. All other review of systems negative.     Objective:    Physical Exam  BP 115/75   Pulse 89   Temp 98.3 F (36.8 C) (Temporal)   Ht 6' 2"  (1.88 m)   Wt (!) 319 lb (144.7 kg)   SpO2 94%   BMI 40.96 kg/m  Wt Readings from Last 3 Encounters:  01/24/20 (!) 319 lb (144.7 kg)  12/13/19 (!) 321 lb (145.6 kg)  08/13/19 (!) 312 lb (141.5 kg)   Physical Exam  Constitutional: Oriented to person, place, and time. Appears well-developed and well-nourished.  HENT:  Head: Normocephalic and atraumatic.  Eyes: Conjunctivae and EOM are normal.  Cardiovascular: Normal rate, regular rhythm, normal heart sounds and intact distal pulses.  No murmur heard. Pulmonary/Chest: Effort normal and breath sounds normal. No stridor. No respiratory distress. Has no wheezes.  Neurological: Is alert and oriented to person, place, and time.  Skin: Skin is warm. Capillary refill takes less than 2 seconds.  Psychiatric: Has a normal mood and affect. Behavior is normal. Judgment and thought content normal.    There are no preventive care reminders to display for this patient.  There are no preventive care reminders to display for this patient.  Lab Results  Component Value Date   TSH 1.880 05/29/2018   Lab Results  Component Value Date   WBC 10.0 08/13/2019   HGB 14.9 08/13/2019   HCT 46.1 08/13/2019   MCV 84 08/13/2019   PLT 260 08/13/2019   Lab Results  Component Value Date   NA 136 08/13/2019   K 4.2 08/13/2019   CO2 23  08/13/2019   GLUCOSE 308 (H) 08/13/2019   BUN 18 08/13/2019   CREATININE 1.02 08/13/2019   BILITOT 0.4 08/13/2019   ALKPHOS 61 08/13/2019   AST 12 08/13/2019   ALT 16 08/13/2019   PROT 7.5 08/13/2019   ALBUMIN 4.7 08/13/2019   CALCIUM 9.6 08/13/2019   ANIONGAP 11 06/14/2016   Lab Results  Component Value Date   CHOL 162 08/13/2019   Lab Results  Component Value Date   HDL 36 (L) 08/13/2019   Lab Results  Component Value Date   LDLCALC 90 08/13/2019   Lab Results  Component Value Date   TRIG 179 (H) 08/13/2019   Lab Results  Component Value Date   CHOLHDL 4.5 08/13/2019   Lab Results  Component Value Date   HGBA1C 12.4 (A) 12/13/2019      Assessment & Plan:   Problem List Items Addressed This Visit      Endocrine   Type 2 diabetes mellitus with hyperglycemia, without long-term current use of insulin (HCC) - Primary   Relevant Medications   lisinopril (ZESTRIL) 5 MG tablet   metFORMIN (GLUCOPHAGE XR) 500 MG 24 hr tablet   rosuvastatin (CRESTOR) 10 MG tablet   empagliflozin (JARDIANCE) 25 MG TABS tablet   Other Relevant Orders   POCT glycosylated hemoglobin (Hb A1C)   CMP14+EGFR     Other   Morbid obesity (HCC)   Relevant Medications   metFORMIN (GLUCOPHAGE XR) 500 MG 24 hr tablet   empagliflozin (JARDIANCE) 25 MG TABS tablet   Erectile dysfunction   Relevant Medications   tadalafil (CIALIS) 10 MG tablet    Other Visit Diagnoses    Uncontrolled type 2 diabetes mellitus with hyperglycemia ([place])       Relevant  Medications   lisinopril (ZESTRIL) 5 MG tablet   metFORMIN (GLUCOPHAGE XR) 500 MG 24 hr tablet   rosuvastatin (CRESTOR) 10 MG tablet   empagliflozin (JARDIANCE) 25 MG TABS tablet   Dyslipidemia       Relevant Medications   rosuvastatin (CRESTOR) 10 MG tablet      1. Diabetes type 2 - improved with compliance to meds Previous a1c 12.4  2. Dyslipidemia-  Discussed his crestor, diet and exercise  3. Erectile Dysfunction - discussed  exercise to improve blood flow, maintain good sugars to decrease vascular disease and lose weight  Refilled Cialis today   4.  Morbid Obesity - improved with low fat, low sugar diet Continue exercise and weight loss   Meds ordered this encounter  Medications  . lisinopril (ZESTRIL) 5 MG tablet    Sig: Take 1 tablet (5 mg total) by mouth daily.    Dispense:  90 tablet    Refill:  0  . metFORMIN (GLUCOPHAGE XR) 500 MG 24 hr tablet    Sig: Take 2 tablets (1,000 mg total) by mouth 2 (two) times daily.    Dispense:  360 tablet    Refill:  0  . rosuvastatin (CRESTOR) 10 MG tablet    Sig: Take 1 tablet (10 mg total) by mouth daily.    Dispense:  90 tablet    Refill:  0  . tadalafil (CIALIS) 10 MG tablet    Sig: Use 0.5 tablet to 1 tablet by mouth every other day as needed for erection    Dispense:  30 tablet    Refill:  3    Please dispense 10 during the coronavirus season to decrease frequency of visits to the pharmacy  . empagliflozin (JARDIANCE) 25 MG TABS tablet    Sig: Take 25 mg by mouth daily.    Dispense:  90 tablet    Refill:  1    Follow-up: Return in about 3 months (around 04/22/2020) for diabetes.    Forrest Moron, MD

## 2020-01-24 ENCOUNTER — Encounter: Payer: Self-pay | Admitting: Family Medicine

## 2020-01-24 ENCOUNTER — Ambulatory Visit: Payer: Commercial Managed Care - PPO | Admitting: Family Medicine

## 2020-01-24 ENCOUNTER — Other Ambulatory Visit: Payer: Self-pay

## 2020-01-24 VITALS — BP 115/75 | HR 89 | Temp 98.3°F | Ht 74.0 in | Wt 319.0 lb

## 2020-01-24 DIAGNOSIS — N529 Male erectile dysfunction, unspecified: Secondary | ICD-10-CM | POA: Diagnosis not present

## 2020-01-24 DIAGNOSIS — E1165 Type 2 diabetes mellitus with hyperglycemia: Secondary | ICD-10-CM | POA: Diagnosis not present

## 2020-01-24 DIAGNOSIS — E785 Hyperlipidemia, unspecified: Secondary | ICD-10-CM | POA: Diagnosis not present

## 2020-01-24 LAB — CMP14+EGFR
ALT: 23 IU/L (ref 0–44)
AST: 17 IU/L (ref 0–40)
Albumin/Globulin Ratio: 1.9 (ref 1.2–2.2)
Albumin: 4.5 g/dL (ref 4.1–5.2)
Alkaline Phosphatase: 46 IU/L (ref 39–117)
BUN/Creatinine Ratio: 7 — ABNORMAL LOW (ref 9–20)
BUN: 7 mg/dL (ref 6–20)
Bilirubin Total: 0.6 mg/dL (ref 0.0–1.2)
CO2: 21 mmol/L (ref 20–29)
Calcium: 9.3 mg/dL (ref 8.7–10.2)
Chloride: 103 mmol/L (ref 96–106)
Creatinine, Ser: 1.07 mg/dL (ref 0.76–1.27)
GFR calc Af Amer: 107 mL/min/{1.73_m2} (ref >59)
GFR calc non Af Amer: 93 mL/min/{1.73_m2} (ref >59)
Globulin, Total: 2.4 g/dL (ref 1.5–4.5)
Glucose: 117 mg/dL — ABNORMAL HIGH (ref 65–99)
Potassium: 4.3 mmol/L (ref 3.5–5.2)
Sodium: 139 mmol/L (ref 134–144)
Total Protein: 6.9 g/dL (ref 6.0–8.5)

## 2020-01-24 LAB — POCT GLYCOSYLATED HEMOGLOBIN (HGB A1C): Hemoglobin A1C: 11.1 % — AB (ref 4.0–5.6)

## 2020-01-24 MED ORDER — TADALAFIL 10 MG PO TABS: ORAL_TABLET | ORAL | 3 refills | Status: DC

## 2020-01-24 MED ORDER — LISINOPRIL 5 MG PO TABS: 5.0000 mg | ORAL_TABLET | Freq: Every day | ORAL | 0 refills | Status: DC

## 2020-01-24 MED ORDER — METFORMIN HCL ER 500 MG PO TB24: 1000.0000 mg | ORAL_TABLET | Freq: Two times a day (BID) | ORAL | 0 refills | Status: DC

## 2020-01-24 MED ORDER — ROSUVASTATIN CALCIUM 10 MG PO TABS: 10.0000 mg | ORAL_TABLET | Freq: Every day | ORAL | 0 refills | Status: DC

## 2020-01-24 MED ORDER — JARDIANCE 25 MG PO TABS: 25.0000 mg | ORAL_TABLET | Freq: Every day | ORAL | 1 refills | Status: DC

## 2020-02-03 ENCOUNTER — Telehealth: Payer: Self-pay | Admitting: Family Medicine

## 2020-02-03 DIAGNOSIS — N529 Male erectile dysfunction, unspecified: Secondary | ICD-10-CM

## 2020-02-03 NOTE — Telephone Encounter (Signed)
Sherrry  with [employer] is  calling regarding this tadalafil (CIALIS) 10 MG tablet   Not recommend to be cut in half / would Provider  like to change to alternative strength   Please call [phone] ref # [phone]

## 2020-02-03 NOTE — Telephone Encounter (Signed)
[facility] do not recommend pill to be cut in half please advise

## 2020-02-07 MED ORDER — TADALAFIL 5 MG PO TABS: ORAL_TABLET | ORAL | 6 refills | Status: DC

## 2020-02-07 NOTE — Telephone Encounter (Signed)
Medication strength changed.

## 2020-04-19 ENCOUNTER — Other Ambulatory Visit: Payer: Self-pay | Admitting: Family Medicine

## 2020-04-19 DIAGNOSIS — E1165 Type 2 diabetes mellitus with hyperglycemia: Secondary | ICD-10-CM

## 2020-04-19 NOTE — Telephone Encounter (Signed)
Requested Prescriptions  Pending Prescriptions Disp Refills  . metFORMIN (GLUCOPHAGE-XR) 500 MG 24 hr tablet [Pharmacy Med Name: METFORMIN HCL ER TABS 500MG] 360 tablet 3    Sig: TAKE 2 TABLETS TWICE A DAY     Endocrinology:  Diabetes - Biguanides Failed - 04/19/2020 10:32 AM      Failed - HBA1C is between 0 and 7.9 and within 180 days    Hemoglobin A1C  Date Value Ref Range Status  01/24/2020 11.1 (A) 4.0 - 5.6 % Final   Hgb A1c MFr Bld  Date Value Ref Range Status  08/13/2019 12.5 (H) 4.8 - 5.6 % Final    Comment:             Prediabetes: 5.7 - 6.4          Diabetes: >6.4          Glycemic control for adults with diabetes: <7.0          Passed - Cr in normal range and within 360 days    Creat  Date Value Ref Range Status  06/14/2016 1.08 0.60 - 1.35 mg/dL Final   Creatinine, Ser  Date Value Ref Range Status  01/24/2020 1.07 0.76 - 1.27 mg/dL Final         Passed - eGFR in normal range and within 360 days    GFR, Est African American  Date Value Ref Range Status  06/14/2016 >89 >=60 mL/min Final   GFR calc Af Amer  Date Value Ref Range Status  01/24/2020 107 >59 mL/min/1.73 Final   GFR, Est Non African American  Date Value Ref Range Status  06/14/2016 >89 >=60 mL/min Final   GFR calc non Af Amer  Date Value Ref Range Status  01/24/2020 93 >59 mL/min/1.73 Final         Passed - Valid encounter within last 6 months    Recent Outpatient Visits          2 months ago Type 2 diabetes mellitus with hyperglycemia, without long-term current use of insulin ([place])   Primary Care at [facility], Zoe A, MD   4 months ago Type 2 diabetes mellitus with hyperglycemia, without long-term current use of insulin ([place])   Primary Care at [facility], Zoe A, MD   8 months ago Uncontrolled type 2 diabetes mellitus without complication, without long-term current use of insulin ([place])   [facility] at [facility], [place], NP   1 year ago Uncontrolled type 2 diabetes  mellitus without complication, without long-term current use of insulin ([place])   Primary Care at [facility], Zoe A, MD   1 year ago Type 2 diabetes mellitus with hyperglycemia, without long-term current use of insulin ([place])   Primary Care at [place], MD      Future Appointments            In 5 days Forrest Moron, MD Primary Care at [place], [facility]           . lisinopril (ZESTRIL) 5 MG tablet [Pharmacy Med Name: LISINOPRIL TABS 5MG] 90 tablet 3    Sig: TAKE 1 TABLET DAILY     Cardiovascular:  ACE Inhibitors Passed - 04/19/2020 10:32 AM      Passed - Cr in normal range and within 180 days    Creat  Date Value Ref Range Status  06/14/2016 1.08 0.60 - 1.35 mg/dL Final   Creatinine, Ser  Date Value Ref Range Status  01/24/2020 1.07 0.76 - 1.27  mg/dL Final         Passed - K in normal range and within 180 days    Potassium  Date Value Ref Range Status  01/24/2020 4.3 3.5 - 5.2 mmol/L Final         Passed - Patient is not pregnant      Passed - Last BP in normal range    BP Readings from Last 1 Encounters:  01/24/20 115/75         Passed - Valid encounter within last 6 months    Recent Outpatient Visits          2 months ago Type 2 diabetes mellitus with hyperglycemia, without long-term current use of insulin ([place])   Primary Care at [place], MD   4 months ago Type 2 diabetes mellitus with hyperglycemia, without long-term current use of insulin ([place])   Primary Care at [place], MD   8 months ago Uncontrolled type 2 diabetes mellitus without complication, without long-term current use of insulin ([place])   [facility] at [facility], [place], NP   1 year ago Uncontrolled type 2 diabetes mellitus without complication, without long-term current use of insulin ([place])   Primary Care at [facility], Zoe A, MD   1 year ago Type 2 diabetes mellitus with hyperglycemia, without long-term current use of insulin [facility])   Primary  Care at [place], MD      Future Appointments            In 5 days Forrest Moron, MD Primary Care at [place], [facility]

## 2020-04-24 ENCOUNTER — Other Ambulatory Visit: Payer: Self-pay

## 2020-04-24 ENCOUNTER — Encounter: Payer: Self-pay | Admitting: Family Medicine

## 2020-04-24 ENCOUNTER — Ambulatory Visit: Payer: Commercial Managed Care - PPO | Admitting: Family Medicine

## 2020-04-24 VITALS — BP 116/80 | HR 76 | Temp 98.0°F | Resp 17 | Ht 74.0 in | Wt 337.2 lb

## 2020-04-24 DIAGNOSIS — N529 Male erectile dysfunction, unspecified: Secondary | ICD-10-CM | POA: Diagnosis not present

## 2020-04-24 DIAGNOSIS — E785 Hyperlipidemia, unspecified: Secondary | ICD-10-CM

## 2020-04-24 DIAGNOSIS — E1165 Type 2 diabetes mellitus with hyperglycemia: Secondary | ICD-10-CM

## 2020-04-24 DIAGNOSIS — Z135 Encounter for screening for eye and ear disorders: Secondary | ICD-10-CM | POA: Diagnosis not present

## 2020-04-24 LAB — POCT GLYCOSYLATED HEMOGLOBIN (HGB A1C): Hemoglobin A1C: 7.5 % — AB (ref 4.0–5.6)

## 2020-04-24 NOTE — Patient Instructions (Signed)
° ° ° °  If you have lab work done today you will be contacted with your lab results within the next 2 weeks.  If you have not heard from us then please contact us. The fastest way to get your results is to register for My Chart. ° ° °IF you received an x-ray today, you will receive an invoice from [facility]. Please contact [facility] at [phone] with questions or concerns regarding your invoice.  ° °IF you received labwork today, you will receive an invoice from [facility]. Please contact [facility] at [phone] with questions or concerns regarding your invoice.  ° °Our billing staff will not be able to assist you with questions regarding bills from these companies. ° °You will be contacted with the lab results as soon as they are available. The fastest way to get your results is to activate your My Chart account. Instructions are located on the last page of this paperwork. If you have not heard from us regarding the results in 2 weeks, please contact this office. °  ° ° ° °

## 2020-04-27 MED ORDER — ROSUVASTATIN CALCIUM 10 MG PO TABS: 10.0000 mg | ORAL_TABLET | Freq: Every day | ORAL | 0 refills | Status: AC

## 2020-04-27 MED ORDER — INSULIN PEN NEEDLE 31G X 5 MM MISC: 1.0000 | Freq: Two times a day (BID) | 1 refills | Status: AC

## 2020-04-27 MED ORDER — TRULICITY 0.75 MG/0.5ML ~~LOC~~ SOAJ: 0.7500 mg | SUBCUTANEOUS | 0 refills | Status: DC

## 2020-04-27 MED ORDER — JARDIANCE 25 MG PO TABS: 25.0000 mg | ORAL_TABLET | Freq: Every day | ORAL | 1 refills | Status: AC

## 2020-04-27 MED ORDER — LISINOPRIL 5 MG PO TABS: 5.0000 mg | ORAL_TABLET | Freq: Every day | ORAL | 3 refills | Status: AC

## 2020-04-27 MED ORDER — SILDENAFIL CITRATE 100 MG PO TABS: 100.0000 mg | ORAL_TABLET | Freq: Every day | ORAL | 3 refills | Status: AC | PRN

## 2020-04-27 MED ORDER — METFORMIN HCL ER 500 MG PO TB24: 1000.0000 mg | ORAL_TABLET | Freq: Two times a day (BID) | ORAL | 3 refills | Status: AC

## 2020-04-27 NOTE — Progress Notes (Signed)
Established Patient Office Visit  Subjective:  Patient ID: Calvin Huber, male    DOB: [birth year]  Age: 29 y.o. MRN: [id number]  CC:  Chief Complaint  Patient presents with  . Diabetes    3 month recheck  . Medication Refill    trulicity, jardiance, glucose test strips, insulin pen needles, zestril, glucophage-xr, crestor, viagra and cialis    HPI Calvin Huber presents for   Diabetes Mellitus: Patient presents for follow up of diabetes. Symptoms: hyperglycemia. Symptoms have stabilized. Patient denies hypoglycemia , increase appetite, paresthesia of the feet and polydipsia.  Evaluation to date has been included: hemoglobin A1C.  Home sugars: BGs consistently in an acceptable range. He is taking trulicity, metformin, jardiance and crestor. He is beginning to exercise.  Component     Latest Ref Rng & Units 12/13/2019 01/24/2020 04/24/2020  Hemoglobin A1C     4.0 - 5.6 % 12.4 (A) 11.1 (A) 7.5 (A)   Wt Readings from Last 3 Encounters:  04/24/20 (!) 337 lb 3.2 oz (153 kg)  01/24/20 (!) 319 lb (144.7 kg)  12/13/19 (!) 321 lb (145.6 kg)     Past Medical History:  Diagnosis Date  . Allergy   . Diabetes mellitus without complication [facility])     Past Surgical History:  Procedure Laterality Date  . KNEE SURGERY      Family History  Problem Relation Age of Onset  . Diabetes Mother   . Diabetes Sister     Social History   Socioeconomic History  . Marital status: Single    Spouse name: Not on file  . Number of children: Not on file  . Years of education: Not on file  . Highest education level: Not on file  Occupational History  . Occupation: Emergency planning/management officer  Tobacco Use  . Smoking status: Never Smoker  . Smokeless tobacco: Never Used  Substance and Sexual Activity  . Alcohol use: No  . Drug use: No  . Sexual activity: Yes    Partners: Female  Other Topics Concern  . Not on file  Social History Narrative   Insurance account manager   Lives with mother      Social Determinants of Health   Financial Resource Strain:   . Difficulty of Paying Living Expenses:   Food Insecurity:   . Worried About Programme researcher, broadcasting/film/video in the Last Year:   . Barista in the Last Year:   Transportation Needs:   . Freight forwarder (Medical):   Marland Kitchen Lack of Transportation (Non-Medical):   Physical Activity:   . Days of Exercise per Week:   . Minutes of Exercise per Session:   Stress:   . Feeling of Stress :   Social Connections:   . Frequency of Communication with Friends and Family:   . Frequency of Social Gatherings with Friends and Family:   . Attends Religious Services:   . Active Member of Clubs or Organizations:   . Attends Banker Meetings:   Marland Kitchen Marital Status:   Intimate Partner Violence:   . Fear of Current or Ex-Partner:   . Emotionally Abused:   Marland Kitchen Physically Abused:   . Sexually Abused:     Outpatient Medications Prior to Visit  Medication Sig Dispense Refill  . Dulaglutide (TRULICITY) 0.75 MG/0.5ML SOPN Inject 0.75 mg into the skin once a week. 12 pen 0  . empagliflozin (JARDIANCE) 25 MG TABS tablet Take 25 mg by mouth daily. 90 tablet 1  .  glucose blood test strip Use as instructed 100 each 12  . ibuprofen (ADVIL) 800 MG tablet TK 1 T PO TID    . Insulin Pen Needle 31G X 5 MM MISC 1 each by Does not apply route 2 (two) times daily before a meal. 50 each 1  . lisinopril (ZESTRIL) 5 MG tablet TAKE 1 TABLET DAILY 90 tablet 3  . metFORMIN (GLUCOPHAGE-XR) 500 MG 24 hr tablet TAKE 2 TABLETS TWICE A DAY 360 tablet 3  . rosuvastatin (CRESTOR) 10 MG tablet Take 1 tablet (10 mg total) by mouth daily. 90 tablet 0  . sildenafil (VIAGRA) 100 MG tablet Take 1 tablet (100 mg total) by mouth daily as needed for erectile dysfunction. 30 tablet 3  . tadalafil (CIALIS) 5 MG tablet Use 0.5 tablet to 1 tablet by mouth every other day as needed for erection 10 tablet 6   No facility-administered medications prior to visit.    No Known  Allergies  ROS Review of Systems    Objective:    Physical Exam  BP 116/80 (BP Location: Right Arm, Patient Position: Sitting, Cuff Size: Large)   Pulse 76   Temp 98 F (36.7 C) (Temporal)   Resp 17   Ht 6\' 2"  (1.88 m)   Wt (!) 337 lb 3.2 oz (153 kg)   SpO2 100%   BMI 43.29 kg/m  Wt Readings from Last 3 Encounters:  04/24/20 (!) 337 lb 3.2 oz (153 kg)  01/24/20 (!) 319 lb (144.7 kg)  12/13/19 (!) 321 lb (145.6 kg)   Physical Exam  Constitutional: Oriented to person, place, and time. Appears well-developed and well-nourished.  HENT:  Head: Normocephalic and atraumatic.  Eyes: Conjunctivae and EOM are normal.  Cardiovascular: Normal rate, regular rhythm, normal heart sounds and intact distal pulses.  No murmur heard. Pulmonary/Chest: Effort normal and breath sounds normal. No stridor. No respiratory distress. Has no wheezes.  Neurological: Is alert and oriented to person, place, and time.  Skin: Skin is warm. Capillary refill takes less than 2 seconds.  Psychiatric: Has a normal mood and affect. Behavior is normal. Judgment and thought content normal.    Health Maintenance Due  Topic Date Due  . COVID-19 Vaccine (1) Never done  . OPHTHALMOLOGY EXAM  04/04/2020    There are no preventive care reminders to display for this patient.  Lab Results  Component Value Date   TSH 1.880 05/29/2018   Lab Results  Component Value Date   WBC 10.0 08/13/2019   HGB 14.9 08/13/2019   HCT 46.1 08/13/2019   MCV 84 08/13/2019   PLT 260 08/13/2019   Lab Results  Component Value Date   NA 139 01/24/2020   K 4.3 01/24/2020   CO2 21 01/24/2020   GLUCOSE 117 (H) 01/24/2020   BUN 7 01/24/2020   CREATININE 1.07 01/24/2020   BILITOT 0.6 01/24/2020   ALKPHOS 46 01/24/2020   AST 17 01/24/2020   ALT 23 01/24/2020   PROT 6.9 01/24/2020   ALBUMIN 4.5 01/24/2020   CALCIUM 9.3 01/24/2020   ANIONGAP 11 06/14/2016   Lab Results  Component Value Date   CHOL 162 08/13/2019   Lab  Results  Component Value Date   HDL 36 (L) 08/13/2019   Lab Results  Component Value Date   LDLCALC 90 08/13/2019   Lab Results  Component Value Date   TRIG 179 (H) 08/13/2019   Lab Results  Component Value Date   CHOLHDL 4.5 08/13/2019   Lab Results  Component Value Date   HGBA1C 7.5 (A) 04/24/2020      Assessment & Plan:   Problem List Items Addressed This Visit      Endocrine   Type 2 diabetes mellitus with hyperglycemia, without long-term current use of insulin ([place]) - Primary A great improvement in blood glucose and a1c hemoglobin a1c is at goal Continue exercise Lipids monitored and renal function in range On metformin On ace or arb Reviewed diabetic foot care Emphasized importance of eye and dental exam      Relevant Orders   POCT glycosylated hemoglobin (Hb A1C) (Completed)    Other Visit Diagnoses    Screening for diabetic retinopathy       Relevant Orders   Ambulatory referral to Ophthalmology      No orders of the defined types were placed in this encounter.   Follow-up: No follow-ups on file.    Forrest Moron, MD

## 2020-06-08 ENCOUNTER — Other Ambulatory Visit: Payer: Self-pay

## 2020-06-08 DIAGNOSIS — E1165 Type 2 diabetes mellitus with hyperglycemia: Secondary | ICD-10-CM

## 2020-06-08 MED ORDER — TRULICITY 0.75 MG/0.5ML ~~LOC~~ SOAJ: 0.7500 mg | SUBCUTANEOUS | 0 refills | Status: AC

## 2020-07-19 ENCOUNTER — Telehealth: Payer: Self-pay | Admitting: Family Medicine

## 2020-07-19 NOTE — Telephone Encounter (Signed)
Referral Followup °

## 2020-07-27 ENCOUNTER — Telehealth: Payer: Self-pay

## 2020-07-27 ENCOUNTER — Other Ambulatory Visit: Payer: Self-pay | Admitting: Registered Nurse

## 2020-09-24 ENCOUNTER — Emergency Department (HOSPITAL_BASED_OUTPATIENT_CLINIC_OR_DEPARTMENT_OTHER): Payer: Commercial Managed Care - PPO

## 2020-09-24 ENCOUNTER — Encounter (HOSPITAL_BASED_OUTPATIENT_CLINIC_OR_DEPARTMENT_OTHER): Payer: Self-pay | Admitting: Emergency Medicine

## 2020-09-24 ENCOUNTER — Emergency Department (HOSPITAL_BASED_OUTPATIENT_CLINIC_OR_DEPARTMENT_OTHER)
Admission: EM | Admit: 2020-09-24 | Discharge: 2020-09-25 | Disposition: A | Discharge status: Home / Self Care | Payer: Commercial Managed Care - PPO | Attending: Emergency Medicine | Admitting: Emergency Medicine

## 2020-09-24 ENCOUNTER — Other Ambulatory Visit: Payer: Self-pay

## 2020-09-24 DIAGNOSIS — U071 COVID-19: Secondary | ICD-10-CM | POA: Diagnosis not present

## 2020-09-24 DIAGNOSIS — Z7984 Long term (current) use of oral hypoglycemic drugs: Secondary | ICD-10-CM | POA: Insufficient documentation

## 2020-09-24 DIAGNOSIS — Z794 Long term (current) use of insulin: Secondary | ICD-10-CM | POA: Diagnosis not present

## 2020-09-24 DIAGNOSIS — J1282 Pneumonia due to coronavirus disease 2019: Secondary | ICD-10-CM | POA: Insufficient documentation

## 2020-09-24 DIAGNOSIS — E1165 Type 2 diabetes mellitus with hyperglycemia: Secondary | ICD-10-CM | POA: Diagnosis not present

## 2020-09-24 DIAGNOSIS — R0602 Shortness of breath: Secondary | ICD-10-CM | POA: Diagnosis present

## 2020-09-24 LAB — BASIC METABOLIC PANEL
Anion gap: 11 (ref 5–15)
BUN: 14 mg/dL (ref 6–20)
CO2: 26 mmol/L (ref 22–32)
Calcium: 8.9 mg/dL (ref 8.9–10.3)
Chloride: 99 mmol/L (ref 98–111)
Creatinine, Ser: 1.15 mg/dL (ref 0.61–1.24)
GFR calc Af Amer: >60 mL/min (ref >60)
GFR calc non Af Amer: >60 mL/min (ref >60)
Glucose, Bld: 198 mg/dL — ABNORMAL HIGH (ref 70–99)
Potassium: 3.8 mmol/L (ref 3.5–5.1)
Sodium: 136 mmol/L (ref 135–145)

## 2020-09-24 LAB — CBC
HCT: 46.4 % (ref 39.0–52.0)
Hemoglobin: 15.2 g/dL (ref 13.0–17.0)
MCH: 26.9 pg (ref 26.0–34.0)
MCHC: 32.8 g/dL (ref 30.0–36.0)
MCV: 82 fL (ref 80.0–100.0)
Platelets: 209 10*3/uL (ref 150–400)
RBC: 5.66 MIL/uL (ref 4.22–5.81)
RDW: 12.9 % (ref 11.5–15.5)
WBC: 5.8 10*3/uL (ref 4.0–10.5)
nRBC: 0 % (ref 0.0–0.2)

## 2020-09-24 LAB — TROPONIN I (HIGH SENSITIVITY): Troponin I (High Sensitivity): 2 ng/L (ref <18)

## 2020-09-24 NOTE — ED Notes (Signed)
Presents with c/o having shortness of breath, states he had Covid dx late September and feels like now even though he is getting over Covid is now having episodes of SOB. Also having some chest pain, states that walking will begin having the chest pain and shortness of breath.

## 2020-09-24 NOTE — ED Notes (Signed)
Pt very diaphoretic. CBG checked, result 189.

## 2020-09-24 NOTE — ED Triage Notes (Signed)
Reports receiving a positive covid result 09/18/20. He has had one covid vaccine 09/10/20. Since dx states his sx have worsened and he feels SHOB, central CP, cough. Other sx have resolved.

## 2020-09-25 ENCOUNTER — Other Ambulatory Visit: Payer: Self-pay | Admitting: Nurse Practitioner

## 2020-09-25 DIAGNOSIS — U071 COVID-19: Secondary | ICD-10-CM

## 2020-09-25 DIAGNOSIS — E1165 Type 2 diabetes mellitus with hyperglycemia: Secondary | ICD-10-CM

## 2020-09-25 LAB — CBG MONITORING, ED: Glucose-Capillary: 189 mg/dL — ABNORMAL HIGH (ref 70–99)

## 2020-09-25 NOTE — Discharge Instructions (Addendum)
You were seen today for COVID-19 symptoms.  Your work-up is reassuring.  You have evidence of COVID-19 pneumonia.  You may qualify for monoclonal antibody treatment.  You have been referred you should receive a call.  Get a portable pulse ox.  If you note worsening shortness of breath or oxygen saturations less than 90%, you should be reevaluated.

## 2020-09-25 NOTE — Progress Notes (Signed)
I connected by phone with Calvin Huber on 09/25/2020 at 9:51 AM to discuss the potential use of a new treatment for mild to moderate COVID-19 viral infection in non-hospitalized patients.  This patient is a 29 y.o. male that meets the FDA criteria for Emergency Use Authorization of COVID monoclonal antibody casirivimab/imdevimab or bamlanivimab/eteseviamb.  Has a (+) direct SARS-CoV-2 viral test result  Has mild or moderate COVID-19   Is NOT hospitalized due to COVID-19  Is within 10 days of symptom onset  Has at least one of the high risk factor(s) for progression to severe COVID-19 and/or hospitalization as defined in EUA.  Specific high risk criteria : BMI > 25 and Diabetes   I have spoken and communicated the following to the patient or parent/caregiver regarding COVID monoclonal antibody treatment:  1. FDA has authorized the emergency use for the treatment of mild to moderate COVID-19 in adults and pediatric patients with positive results of direct SARS-CoV-2 viral testing who are 23 years of age and older weighing at least 40 kg, and who are at high risk for progressing to severe COVID-19 and/or hospitalization.  2. The significant known and potential risks and benefits of COVID monoclonal antibody, and the extent to which such potential risks and benefits are unknown.  3. Information on available alternative treatments and the risks and benefits of those alternatives, including clinical trials.  4. Patients treated with COVID monoclonal antibody should continue to self-isolate and use infection control measures (e.g., wear mask, isolate, social distance, avoid sharing personal items, clean and disinfect "high touch" surfaces, and frequent handwashing) according to CDC guidelines.   5. The patient or parent/caregiver has the option to accept or refuse COVID monoclonal antibody treatment.  After reviewing this information with the patient, the patient has agreed to receive one  of the available covid 19 monoclonal antibodies and will be provided an appropriate fact sheet prior to infusion. [name], NP 09/25/2020 9:51 AM

## 2020-09-25 NOTE — ED Provider Notes (Signed)
[facility] EMERGENCY DEPARTMENT Provider Note   CSN: [id number] Arrival date & time: 09/24/20  2055     History Chief Complaint  Patient presents with  . Shortness of Breath    Calvin Huber is a 29 y.o. male.  HPI     This a 29 year old male with a history of diabetes and obesity who presents with shortness of breath, chest pain, cough.  Patient received his first dose of Covid vaccine on 9/19.  He developed symptoms of COVID-19 on 9/26 and tested positive on 9/27.  Patient reports initially he lost a sense of taste and smell but that has returned.  He reports ongoing nonproductive cough, shortness of breath, and occasional chest discomfort.  Denies lower extremity swelling.  He states that his symptoms are exertional in nature specifically shortness of breath.  However, he does have intermittent chest pains.  He is not having chest pain at this moment.  He has not taken anything for symptoms.  He has not noted fever or myalgias.  Past Medical History:  Diagnosis Date  . Allergy   . Diabetes mellitus without complication [facility])     Patient Active Problem List   Diagnosis Date Noted  . Erectile dysfunction 12/18/2018  . Type 2 diabetes mellitus with hyperglycemia, without long-term current use of insulin ([facility]) 05/30/2017  . Morbid obesity ([facility]) 09/16/2015  . Hyperglycemia 09/14/2015    Past Surgical History:  Procedure Laterality Date  . KNEE SURGERY         Family History  Problem Relation Age of Onset  . Diabetes Mother   . Diabetes Sister     Social History   Tobacco Use  . Smoking status: Never Smoker  . Smokeless tobacco: Never Used  Vaping Use  . Vaping Use: Never used  Substance Use Topics  . Alcohol use: No  . Drug use: No    Home Medications Prior to Admission medications   Medication Sig Start Date End Date Taking? Authorizing Provider  Dulaglutide (TRULICITY) 0.75 MG/0.5ML SOPN Inject 0.5 mLs (0.75 mg total) into the skin once a  week. 06/08/20   Myles Lipps, MD  empagliflozin (JARDIANCE) 25 MG TABS tablet Take 25 mg by mouth daily. 04/27/20   Collie Siad A, MD  glucose blood test strip Use as instructed 05/29/18   Weber, Dema Severin, PA-C  ibuprofen (ADVIL) 800 MG tablet TK 1 T PO TID 03/01/19   [provider]  Insulin Pen Needle 31G X 5 MM MISC 1 each by Does not apply route 2 (two) times daily before a meal. 04/27/20   Stallings, Zoe A, MD  lisinopril (ZESTRIL) 5 MG tablet Take 1 tablet (5 mg total) by mouth daily. 04/27/20   Doristine Bosworth, MD  metFORMIN (GLUCOPHAGE-XR) 500 MG 24 hr tablet Take 2 tablets (1,000 mg total) by mouth 2 (two) times daily. 04/27/20   Doristine Bosworth, MD  rosuvastatin (CRESTOR) 10 MG tablet Take 1 tablet (10 mg total) by mouth daily. 04/27/20   Doristine Bosworth, MD  sildenafil (VIAGRA) 100 MG tablet Take 1 tablet (100 mg total) by mouth daily as needed for erectile dysfunction. 04/27/20   Doristine Bosworth, MD    Allergies    Patient has no known allergies.  Review of Systems   Review of Systems  Constitutional: Negative for fever.  Respiratory: Positive for cough and shortness of breath.   Cardiovascular: Positive for chest pain. Negative for leg swelling.  Gastrointestinal: Negative for abdominal  pain, nausea and vomiting.  Musculoskeletal: Negative for myalgias.  All other systems reviewed and are negative.   Physical Exam Updated Vital Signs BP 112/75 (BP Location: Right Arm)   Pulse 72   Resp 18   Ht 1.905 m (6\' 3" )   Wt 136.1 kg   SpO2 98%   BMI 37.50 kg/m   Physical Exam Vitals and nursing note reviewed.  Constitutional:      Appearance: He is well-developed. He is obese. He is not ill-appearing.  HENT:     Head: Normocephalic and atraumatic.  Eyes:     Pupils: Pupils are equal, round, and reactive to light.  Cardiovascular:     Rate and Rhythm: Normal rate and regular rhythm.     Heart sounds: Normal heart sounds. No murmur heard.   Pulmonary:     Effort:  Pulmonary effort is normal. No respiratory distress.     Breath sounds: Normal breath sounds. No wheezing.  Chest:     Chest wall: No tenderness.  Abdominal:     General: Bowel sounds are normal.     Palpations: Abdomen is soft.     Tenderness: There is no abdominal tenderness. There is no rebound.  Musculoskeletal:     Cervical back: Neck supple.  Skin:    General: Skin is warm and dry.  Neurological:     Mental Status: He is alert and oriented to person, place, and time.  Psychiatric:        Mood and Affect: Mood normal.     ED Results / Procedures / Treatments   Labs (all labs ordered are listed, but only abnormal results are displayed) Labs Reviewed  BASIC METABOLIC PANEL - Abnormal; Notable for the following components:      Result Value   Glucose, Bld 198 (*)    All other components within normal limits  CBC  TROPONIN I (HIGH SENSITIVITY)  TROPONIN I (HIGH SENSITIVITY)    EKG EKG Interpretation  Date/Time:  Sunday September 24 2020 21:56:14 EDT Ventricular Rate:  70 PR Interval:  140 QRS Duration: 78 QT Interval:  370 QTC Calculation: 399 R Axis:   28 Text Interpretation: Normal sinus rhythm Low voltage QRS Nonspecific T wave abnormality Abnormal ECG Confirmed by 07-11-2006 (Ross Marcus) on 09/24/2020 11:26:31 PM   Radiology DG Chest Port 1 View  Result Date: 09/24/2020 CLINICAL DATA:  Cough, congestion, fever, shortness of breath, possible COVID EXAM: PORTABLE CHEST 1 VIEW COMPARISON:  Chest x-ray 08/06/2005 FINDINGS: Cardiomediastinal silhouette is normal. Hazy airspace opacity at the right base likely changes. No pulmonary edema. No pleural effusion. No pneumothorax. No acute osseous abnormality. IMPRESSION: Hazy airspace opacity at the right base likely changes. Electronically Signed   By: 08/08/2005 M.D.   On: 09/24/2020 22:16    Procedures Procedures (including critical care time)  Medications Ordered in ED Medications - No data to display  ED  Course  I have reviewed the triage vital signs and the nursing notes.  Pertinent labs & imaging results that were available during my care of the patient were reviewed by me and considered in my medical decision making (see chart for details).    MDM Rules/Calculators/A&P                          Patient presents with cough, shortness of breath, chest pain in the setting of COVID-19.  He did receive 1 vaccine 1 week prior to testing positive.  He is  overall nontoxic vital signs are reassuring.  Pulse ox 98%.  While in the room it was 98 to 100%.  He is in no respiratory distress.  Pulmonary and cardiac exams are reassuring.  EKG without acute ischemic or arrhythmic changes.  Chest x-ray shows a hazy airspace opacity in the right lung base.  Suspect that this is likely related to his known COVID-19 infection. Lab work reviewed and largely reassuring.  He is mildly hyperglycemic but without anion gap.  Discussed with patient that his work-up was reassuring.  O2 sats are also reassuring.  Recommend obtaining pulse ox to monitor at home.  Low suspicion at this time for other etiology including PE.  Will refer for monoclonal antibody therapy.  After history, exam, and medical workup I feel the patient has been appropriately medically screened and is safe for discharge home. Pertinent diagnoses were discussed with the patient. Patient was given return precautions.  Calvin Huber was evaluated in Emergency Department on 09/25/2020 for the symptoms described in the history of present illness. He was evaluated in the context of the global COVID-19 pandemic, which necessitated consideration that the patient might be at risk for infection with the SARS-CoV-2 virus that causes COVID-19. Institutional protocols and algorithms that pertain to the evaluation of patients at risk for COVID-19 are in a state of rapid change based on information released by regulatory bodies including the CDC and federal and state  organizations. These policies and algorithms were followed during the patient's care in the ED.   Final Clinical Impression(s) / ED Diagnoses Final diagnoses:  Pneumonia due to COVID-19 virus    Rx / DC Orders ED Discharge Orders    None       Derron Pipkins, Mayer Masker, MD 09/25/20 [phone]

## 2020-09-26 ENCOUNTER — Ambulatory Visit (HOSPITAL_COMMUNITY): Admission: RE | Admit: 2020-09-26 | Payer: Commercial Managed Care - PPO | Source: Ambulatory Visit

## 2021-01-14 IMAGING — DX DG CHEST 1V PORT
1 series · 1 of 1 positions shown · non-contrast
Comparison: Chest x-ray 08/06/2005

CLINICAL DATA: Cough, congestion, fever, shortness of breath,
possible COVID

EXAM:
PORTABLE CHEST 1 VIEW

[chest ap]
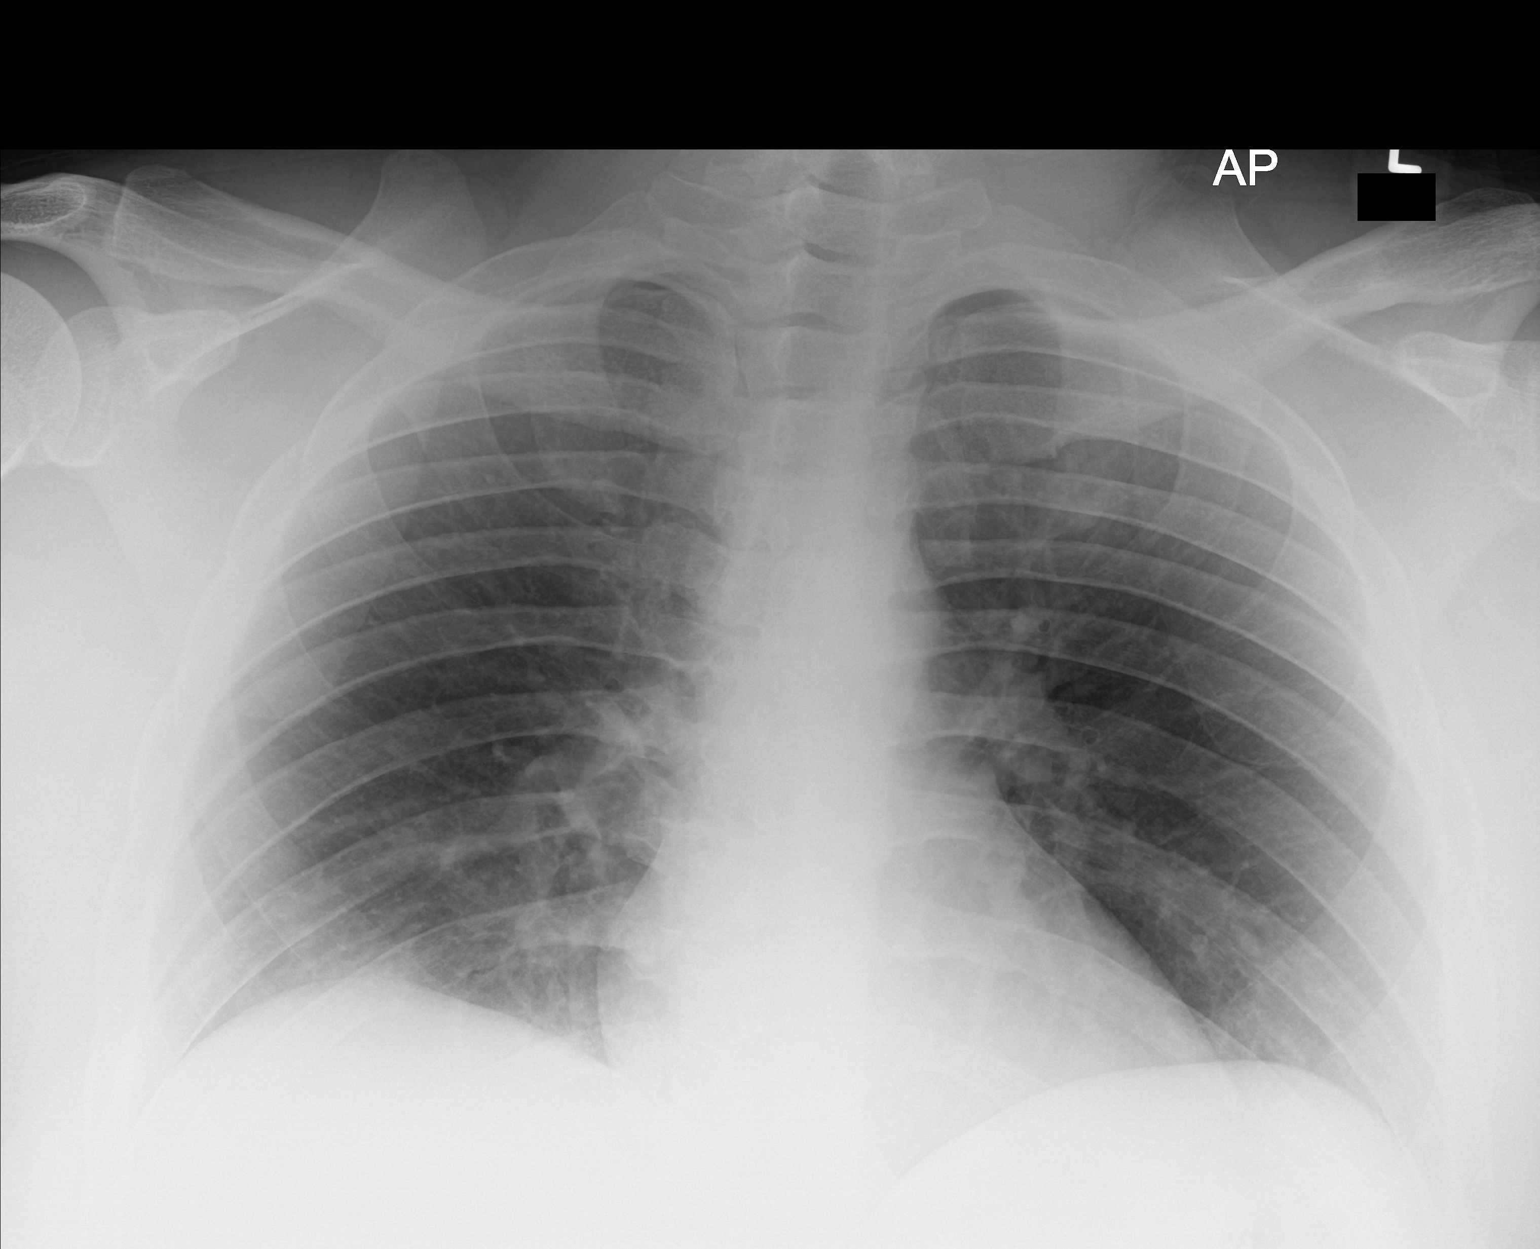

[1 of 1 positions shown; findings below may reference images not displayed]

FINDINGS: Cardiomediastinal silhouette is normal.

Hazy airspace opacity at the right base likely changes. No pulmonary
edema. No pleural effusion. No pneumothorax.

No acute osseous abnormality.
IMPRESSION: Hazy airspace opacity at the right base likely changes.

## 2021-01-25 NOTE — Telephone Encounter (Signed)
No action needed, closing encounter
